# Patient Record
Sex: Male | Born: 1980 | Race: Black or African American | Hispanic: No | Marital: Single | Smoking: Current every day smoker
Health system: Southern US, Community
[De-identification: ages and names within clinical notes are randomized; demographics above are authoritative.]

## PROBLEM LIST (undated history)

## (undated) ENCOUNTER — Emergency Department (HOSPITAL_BASED_OUTPATIENT_CLINIC_OR_DEPARTMENT_OTHER): Payer: Self-pay

## (undated) DIAGNOSIS — Z789 Other specified health status: Secondary | ICD-10-CM

## (undated) HISTORY — PX: NO PAST SURGERIES: SHX2092

## (undated) HISTORY — PX: FOOT SURGERY: SHX648

---

## 2021-08-05 ENCOUNTER — Emergency Department (HOSPITAL_BASED_OUTPATIENT_CLINIC_OR_DEPARTMENT_OTHER): Admit: 2021-08-05 | Discharge: 2021-08-05 | Disposition: A | Discharge status: Home / Self Care | Payer: Self-pay

## 2021-08-05 ENCOUNTER — Inpatient Hospital Stay (HOSPITAL_COMMUNITY)
Admission: EM | Admit: 2021-08-05 | Discharge: 2021-08-07 | DRG: 603 | Disposition: A | Discharge status: Home / Self Care | Payer: Self-pay | Attending: Internal Medicine | Admitting: Internal Medicine

## 2021-08-05 ENCOUNTER — Encounter (HOSPITAL_COMMUNITY): Payer: Self-pay | Admitting: Emergency Medicine

## 2021-08-05 DIAGNOSIS — M7989 Other specified soft tissue disorders: Secondary | ICD-10-CM

## 2021-08-05 DIAGNOSIS — M25572 Pain in left ankle and joints of left foot: Secondary | ICD-10-CM

## 2021-08-05 DIAGNOSIS — Z720 Tobacco use: Secondary | ICD-10-CM | POA: Diagnosis present

## 2021-08-05 DIAGNOSIS — Z5901 Sheltered homelessness: Secondary | ICD-10-CM

## 2021-08-05 DIAGNOSIS — F17210 Nicotine dependence, cigarettes, uncomplicated: Secondary | ICD-10-CM | POA: Diagnosis present

## 2021-08-05 DIAGNOSIS — L03116 Cellulitis of left lower limb: Principal | ICD-10-CM | POA: Diagnosis present

## 2021-08-05 DIAGNOSIS — K219 Gastro-esophageal reflux disease without esophagitis: Secondary | ICD-10-CM | POA: Diagnosis present

## 2021-08-05 DIAGNOSIS — Z716 Tobacco abuse counseling: Secondary | ICD-10-CM

## 2021-08-05 DIAGNOSIS — R590 Localized enlarged lymph nodes: Secondary | ICD-10-CM | POA: Diagnosis present

## 2021-08-05 DIAGNOSIS — L03115 Cellulitis of right lower limb: Secondary | ICD-10-CM | POA: Diagnosis present

## 2021-08-05 DIAGNOSIS — F69 Unspecified disorder of adult personality and behavior: Secondary | ICD-10-CM | POA: Diagnosis present

## 2021-08-05 DIAGNOSIS — F32A Depression, unspecified: Secondary | ICD-10-CM | POA: Diagnosis present

## 2021-08-05 DIAGNOSIS — Z20822 Contact with and (suspected) exposure to covid-19: Secondary | ICD-10-CM | POA: Diagnosis present

## 2021-08-05 HISTORY — DX: Other specified health status: Z78.9

## 2021-08-05 LAB — CBC WITH DIFFERENTIAL/PLATELET
Abs Immature Granulocytes: 0.02 10*3/uL (ref 0.00–0.07)
Basophils Absolute: 0 10*3/uL (ref 0.0–0.1)
Basophils Relative: 0 %
Eosinophils Absolute: 0.2 10*3/uL (ref 0.0–0.5)
Eosinophils Relative: 2 %
HCT: 47.5 % (ref 39.0–52.0)
Hemoglobin: 16.2 g/dL (ref 13.0–17.0)
Immature Granulocytes: 0 %
Lymphocytes Relative: 34 %
Lymphs Abs: 3.1 10*3/uL (ref 0.7–4.0)
MCH: 31.2 pg (ref 26.0–34.0)
MCHC: 34.1 g/dL (ref 30.0–36.0)
MCV: 91.5 fL (ref 80.0–100.0)
Monocytes Absolute: 0.8 10*3/uL (ref 0.1–1.0)
Monocytes Relative: 8 %
Neutro Abs: 5 10*3/uL (ref 1.7–7.7)
Neutrophils Relative %: 56 %
Platelets: 320 10*3/uL (ref 150–400)
RBC: 5.19 MIL/uL (ref 4.22–5.81)
RDW: 13.2 % (ref 11.5–15.5)
WBC: 9.2 10*3/uL (ref 4.0–10.5)
nRBC: 0 % (ref 0.0–0.2)

## 2021-08-05 LAB — BASIC METABOLIC PANEL
Anion gap: 12 (ref 5–15)
BUN: 13 mg/dL (ref 6–20)
CO2: 28 mmol/L (ref 22–32)
Calcium: 8.8 mg/dL — ABNORMAL LOW (ref 8.9–10.3)
Chloride: 104 mmol/L (ref 98–111)
Creatinine, Ser: 0.9 mg/dL (ref 0.61–1.24)
GFR, Estimated: >60 mL/min (ref >60)
Glucose, Bld: 89 mg/dL (ref 70–99)
Potassium: 3.9 mmol/L (ref 3.5–5.1)
Sodium: 144 mmol/L (ref 135–145)

## 2021-08-05 LAB — RAPID URINE DRUG SCREEN, HOSP PERFORMED
Amphetamines: NOT DETECTED
Barbiturates: NOT DETECTED
Benzodiazepines: NOT DETECTED
Cocaine: POSITIVE — AB
Opiates: NOT DETECTED
Tetrahydrocannabinol: POSITIVE — AB

## 2021-08-05 LAB — RESP PANEL BY RT-PCR (FLU A&B, COVID) ARPGX2
Influenza A by PCR: NEGATIVE
Influenza B by PCR: NEGATIVE
SARS Coronavirus 2 by RT PCR: NEGATIVE

## 2021-08-05 LAB — ETHANOL: Alcohol, Ethyl (B): <10 mg/dL (ref <10)

## 2021-08-05 MED ORDER — LORAZEPAM 2 MG/ML IJ SOLN
0.0000 mg | Freq: Four times a day (QID) | INTRAMUSCULAR | Status: DC
  Administered 2021-08-05: 2 mg via INTRAVENOUS

## 2021-08-05 MED ORDER — FOLIC ACID 1 MG PO TABS
1.0000 mg | ORAL_TABLET | Freq: Every day | ORAL | Status: DC
  Administered 2021-08-05 – 2021-08-07 (×3): 1 mg via ORAL
  Filled 2021-08-05 (×3): qty 1

## 2021-08-05 MED ORDER — IBUPROFEN 200 MG PO TABS
400.0000 mg | ORAL_TABLET | ORAL | Status: DC | PRN
  Administered 2021-08-07: 400 mg via ORAL
  Filled 2021-08-05: qty 2

## 2021-08-05 MED ORDER — THIAMINE HCL 100 MG PO TABS
100.0000 mg | ORAL_TABLET | Freq: Every day | ORAL | Status: DC
  Administered 2021-08-05 – 2021-08-07 (×3): 100 mg via ORAL
  Filled 2021-08-05 (×3): qty 1

## 2021-08-05 MED ORDER — ACETAMINOPHEN 650 MG RE SUPP: 650.0000 mg | Freq: Four times a day (QID) | RECTAL | Status: DC | PRN

## 2021-08-05 MED ORDER — ADULT MULTIVITAMIN W/MINERALS CH
1.0000 | ORAL_TABLET | Freq: Every day | ORAL | Status: DC
  Administered 2021-08-05 – 2021-08-07 (×3): 1 via ORAL
  Filled 2021-08-05 (×3): qty 1

## 2021-08-05 MED ORDER — THIAMINE HCL 100 MG/ML IJ SOLN: 100.0000 mg | Freq: Every day | INTRAMUSCULAR | Status: DC

## 2021-08-05 MED ORDER — LORAZEPAM 2 MG/ML IJ SOLN: 0.0000 mg | Freq: Two times a day (BID) | INTRAMUSCULAR | Status: DC

## 2021-08-05 MED ORDER — ONDANSETRON HCL 4 MG/2ML IJ SOLN: 4.0000 mg | Freq: Four times a day (QID) | INTRAMUSCULAR | Status: DC | PRN

## 2021-08-05 MED ORDER — LORAZEPAM 2 MG/ML IJ SOLN
1.0000 mg | INTRAMUSCULAR | Status: DC | PRN
  Filled 2021-08-05: qty 1

## 2021-08-05 MED ORDER — LORAZEPAM 1 MG PO TABS
1.0000 mg | ORAL_TABLET | ORAL | Status: DC | PRN
  Administered 2021-08-05: 1 mg via ORAL
  Administered 2021-08-06: 2 mg via ORAL
  Filled 2021-08-05 (×2): qty 2
  Filled 2021-08-05: qty 1

## 2021-08-05 MED ORDER — FAMOTIDINE 20 MG PO TABS
20.0000 mg | ORAL_TABLET | Freq: Two times a day (BID) | ORAL | Status: DC
  Administered 2021-08-05 – 2021-08-07 (×5): 20 mg via ORAL
  Filled 2021-08-05 (×5): qty 1

## 2021-08-05 MED ORDER — ACETAMINOPHEN 325 MG PO TABS
650.0000 mg | ORAL_TABLET | Freq: Four times a day (QID) | ORAL | Status: DC | PRN
  Administered 2021-08-06: 650 mg via ORAL
  Filled 2021-08-05: qty 2

## 2021-08-05 MED ORDER — ENOXAPARIN SODIUM 40 MG/0.4ML IJ SOSY
40.0000 mg | PREFILLED_SYRINGE | INTRAMUSCULAR | Status: DC
  Administered 2021-08-05 – 2021-08-06 (×2): 40 mg via SUBCUTANEOUS
  Filled 2021-08-05 (×2): qty 0.4

## 2021-08-05 MED ORDER — ONDANSETRON HCL 4 MG PO TABS: 4.0000 mg | ORAL_TABLET | Freq: Four times a day (QID) | ORAL | Status: DC | PRN

## 2021-08-05 MED ORDER — SODIUM CHLORIDE 0.9 % IV SOLN
1.0000 g | Freq: Once | INTRAVENOUS | Status: AC
  Administered 2021-08-05: 1 g via INTRAVENOUS
  Filled 2021-08-05: qty 10

## 2021-08-05 MED ORDER — CEFTRIAXONE SODIUM 1 G IJ SOLR
1.0000 g | INTRAMUSCULAR | Status: DC
Start: 2021-08-06 — End: 2021-08-07
  Administered 2021-08-06 – 2021-08-07 (×2): 1 g via INTRAVENOUS
  Filled 2021-08-05 (×2): qty 10
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
## 2021-08-05 NOTE — Progress Notes (Signed)
TRH H&P addendum:  The behavioral health service has requested UDS, alcohol level, thiamine and preemptive EtOH withdrawal orders.  These orders have already been placed per their request.  Sanda Klein, MD.

## 2021-08-05 NOTE — ED Notes (Signed)
VAS US at bedside. 

## 2021-08-05 NOTE — ED Provider Notes (Signed)
Accepted handoff at shift change from [employer]. Please see prior provider note for more detail.   Briefly: Patient is 40 y.o.   DDX: concern for cellulitis vs DVT and cellulitis  Plan: DVT study pending. The patient is not a candidate for Dalbavancin. Will admit.    Physical Exam  BP 138/90   Pulse 93   Temp 98.6 F (37 C)   Resp 16   Ht 5\' 11"  (1.803 m)   Wt 81.6 kg   SpO2 98%   BMI 25.10 kg/m   Physical Exam  ED Course/Procedures     Procedures  MDM  I received this patient during handoff. Plan is to admit based cellulitis, DVT study ordered to r/o. DVT came back negative, with small cystic area in area of pop fossa 3.8 x 3.3 x 1.0 cm. VSS. Admitted to Dr. for LLE cellulitis.        Robb Matar, PA-C 08/05/21 1138    10/05/21, MD 08/08/21 1112
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
## 2021-08-05 NOTE — ED Notes (Signed)
Updated patient about plan of care and waiting on an admission bed.  NAD.

## 2021-08-05 NOTE — Progress Notes (Signed)
LLE venous duplex has been completed.  Preliminary results given to Riley Ransom, PA. 

## 2021-08-06 ENCOUNTER — Other Ambulatory Visit (HOSPITAL_COMMUNITY): Payer: Self-pay

## 2021-08-06 DIAGNOSIS — L03115 Cellulitis of right lower limb: Secondary | ICD-10-CM | POA: Diagnosis present

## 2021-08-06 DIAGNOSIS — M7989 Other specified soft tissue disorders: Secondary | ICD-10-CM

## 2021-08-06 DIAGNOSIS — Z720 Tobacco use: Secondary | ICD-10-CM

## 2021-08-06 LAB — BASIC METABOLIC PANEL
Anion gap: 7 (ref 5–15)
BUN: 15 mg/dL (ref 6–20)
CO2: 26 mmol/L (ref 22–32)
Calcium: 9.2 mg/dL (ref 8.9–10.3)
Chloride: 110 mmol/L (ref 98–111)
Creatinine, Ser: 0.87 mg/dL (ref 0.61–1.24)
GFR, Estimated: >60 mL/min (ref >60)
Glucose, Bld: 117 mg/dL — ABNORMAL HIGH (ref 70–99)
Potassium: 4.1 mmol/L (ref 3.5–5.1)
Sodium: 143 mmol/L (ref 135–145)

## 2021-08-06 LAB — HIV ANTIBODY (ROUTINE TESTING W REFLEX): HIV Screen 4th Generation wRfx: NONREACTIVE

## 2021-08-06 MED ORDER — ESCITALOPRAM OXALATE 10 MG PO TABS
5.0000 mg | ORAL_TABLET | Freq: Every day | ORAL | Status: DC
  Administered 2021-08-06 – 2021-08-07 (×2): 5 mg via ORAL
  Filled 2021-08-06 (×2): qty 1

## 2021-08-06 MED ORDER — TRAZODONE HCL 50 MG PO TABS
25.0000 mg | ORAL_TABLET | Freq: Every day | ORAL | Status: DC
  Administered 2021-08-06: 25 mg via ORAL
  Filled 2021-08-06: qty 1

## 2021-08-06 NOTE — TOC Initial Note (Addendum)
Transition of Care [facility]) - Initial/Assessment Note    Patient Details  Name: Zachary Kelley MRN: [id number] Date of Birth: [birth year]  [facility]) CM/SW Contact:    Lanier Clam, RN Phone Number: 08/06/2021, 12:23 PM  Clinical Narrative:Spoke to patient about d/c plans-stays in hotel hallways-agreed to CM assisting w/[facility] day shelter resource-he is familiar with-Also provided with shelter resource list-encouraged to call to be placed on wait list or if has a bed decide if he can go there. [facility] can manage meds if appropriate.Nsg can provide ambulatory transportation through [facility] safe ride, & contact chaplain for clothes. Continue to monitor.                 4p-Will provide MATCH program-[facility] beds to bed tomorrow.  Expected Discharge Plan: Homeless Shelter Barriers to Discharge: Continued Medical Work up   Patient Goals and CMS Choice        Expected Discharge Plan and Services Expected Discharge Plan: Homeless Shelter                                              Prior Living Arrangements/Services                       Activities of Daily Living Home Assistive Devices/Equipment: None ADL Screening (condition at time of admission) Patient's cognitive ability adequate to safely complete daily activities?: Yes Is the patient deaf or have difficulty hearing?: No Does the patient have difficulty seeing, even when wearing glasses/contacts?: No Does the patient have difficulty concentrating, remembering, or making decisions?: No Patient able to express need for assistance with ADLs?: Yes Does the patient have difficulty dressing or bathing?: No Independently performs ADLs?: Yes (appropriate for developmental age) Does the patient have difficulty walking or climbing stairs?: No Weakness of Legs: None Weakness of Arms/Hands: None  Permission Sought/Granted                  Emotional Assessment               Admission diagnosis:  Cellulitis of left lower extremity [L03.116] Left leg swelling [M79.89] Patient Active Problem List   Diagnosis Date Noted   Cellulitis of left lower extremity 08/05/2021   Hypocalcemia 08/05/2021   Tobacco abuse 08/05/2021   Behavior concern in adult 08/05/2021   PCP:  Pcp, No Pharmacy:   [id number] - [place], [place] - [address] AT [facility] [address] [place] [address] Phone: [phone] Fax: [phone]     Social Determinants of Health (SDOH) Interventions    Readmission Risk Interventions No flowsheet data found.

## 2021-08-06 NOTE — Progress Notes (Signed)
PROGRESS NOTE    Zachary Kelley  ZOX:[id number] DOB: 1981-09-05 DOA: 08/05/2021 PCP: Pcp, No    Brief Narrative:  Zachary Kelley presented with LLE cellulitis. HX of an unspecified mental history, psychiatry has seen him. He reports several days of LLE pain and erythema, [place] of LLE in ED did not show any findings of DVT. Initial labs showed a neutrophilic shift of a wbc 9.2. Antibiotic treatment given, pain controlled, and psych questions were addressed with psychiatry.    Assessment & Plan:   Principal Problem:   Cellulitis of left lower extremity Active Problems:   Hypocalcemia   Tobacco abuse   Behavior concern in adult   Cellulitis:  LLE is tender, warm to the touch  DVT not present per LLE [place]  Normal WBC with neutrophilic shift   Plan: Ceftriaxone IV initially Transition to po Cephalexin or Cefuroxime  Hypocalcemia: Resolved  Tobacco Abuse -cessation information  Behavior Concern in Adult -seen by psych -escitalopram 5mg  po qd -trazodone 25mg  qhs for sleep prn    Patient continue to be at high risk for cellultis  Status is: Observation  The patient remains OBS appropriate and will d/c before 2 midnights.  Dispo: The patient is from:  homeless , hotel              Anticipated d/c is to:  the hotel              Patient currently is medically stable to d/c.   Difficult to place patient No       DVT prophylaxis: lovenox  Code Status:   full  Family Communication:  none        Skin Documentation:  Left lower leg is tender and slightly swollen   Consultants:  Psychiatry   Procedures:  LE   Antimicrobials:  Ceftriaxone 1g 10/5- current   Subjective: He reports only left ankle pain and tenderness with palpation  Objective: Vitals:   08/05/21 2300 08/06/21 0444 08/06/21 0800 08/06/21 1400  BP: 120/72 101/63 122/74 132/77  Pulse: 67 72 69 68  Resp: 18 15 16 18   Temp: 98.3 F (36.8 C) 98.5 F (36.9 C) 98.9 F (37.2 C) 98 F  (36.7 C)  TempSrc: Oral Oral Oral Oral  SpO2: 100% 100% 100% 100%  Weight:      Height:        Intake/Output Summary (Last 24 hours) at 08/06/2021 1426 Last data filed at 08/06/2021 Gross per 24 hour  Intake 100 ml  Output --  Net 100 ml   Filed Weights   08/05/21 0515  Weight: 81.6 kg    Examination:   General: Not in pain or dyspnea  Neurology: Awake and alert, non focal   E ENT: no pallor, no icterus, oral mucosa moist  Cardiovascular: No JVD. S1-S2 present, rhythmic, no gallops, rubs, or murmurs. No lower extremity edema.  Pulmonary: vesicular breath sounds bilaterally, adequate air movement, no wheezing, rhonchi or rales.  Gastrointestinal. Abdomen flat, no organomegaly, non tender, no rebound or guarding  Skin. No rashes  Musculoskeletal: no joint deformities     Data Reviewed: I have personally reviewed following labs and imaging studies  CBC: Recent Labs  Lab 08/05/21 0546  WBC 9.2  NEUTROABS 5.0  HGB 16.2  HCT 47.5  MCV 91.5  PLT 320   Basic Metabolic Panel: Recent Labs  Lab 08/05/21 0546 08/06/21 0411  NA 144 143  K 3.9 4.1  CL 104 110  CO2 28 26  GLUCOSE 89 117*  BUN 13 15  CREATININE 0.90 0.87  CALCIUM 8.8* 9.2   GFR: Estimated Creatinine Clearance: 122.6 mL/min (by C-G formula based on SCr of 0.87 mg/dL). Liver Function Tests: No results for input(s): AST, ALT, ALKPHOS, BILITOT, PROT, ALBUMIN in the last 168 hours. No results for input(s): LIPASE, AMYLASE in the last 168 hours. No results for input(s): AMMONIA in the last 168 hours. Coagulation Profile: No results for input(s): INR, PROTIME in the last 168 hours. Cardiac Enzymes: No results for input(s): CKTOTAL, CKMB, CKMBINDEX, TROPONINI in the last 168 hours. BNP (last 3 results) No results for input(s): PROBNP in the last 8760 hours. HbA1C: No results for input(s): HGBA1C in the last 72 hours. CBG: No results for input(s): GLUCAP in the last 168 hours. Lipid  Profile: No results for input(s): CHOL, HDL, LDLCALC, TRIG, CHOLHDL, LDLDIRECT in the last 72 hours. Thyroid Function Tests: No results for input(s): TSH, T4TOTAL, FREET4, T3FREE, THYROIDAB in the last 72 hours. Anemia Panel: No results for input(s): VITAMINB12, FOLATE, FERRITIN, TIBC, IRON, RETICCTPCT in the last 72 hours.    Radiology Studies: I have reviewed all of the imaging during this hospital visit personally     Scheduled Meds:  enoxaparin (LOVENOX) injection  40 mg Subcutaneous Q24H   escitalopram  5 mg Oral Daily   famotidine  20 mg Oral BID   folic acid  1 mg Oral Daily   multivitamin with minerals  1 tablet Oral Daily   thiamine  100 mg Oral Daily   Or   thiamine  100 mg Intravenous Daily   traZODone  25 mg Oral QHS   Continuous Infusions:  cefTRIAXone (ROCEPHIN)  IV 1 g (08/06/21 0605)     LOS: 0 days        Randell Loop, student
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
## 2021-08-07 ENCOUNTER — Other Ambulatory Visit (HOSPITAL_COMMUNITY): Payer: Self-pay

## 2021-08-07 DIAGNOSIS — L03115 Cellulitis of right lower limb: Secondary | ICD-10-CM

## 2021-08-07 DIAGNOSIS — F69 Unspecified disorder of adult personality and behavior: Secondary | ICD-10-CM

## 2021-08-07 MED ORDER — IBUPROFEN 400 MG PO TABS
400.0000 mg | ORAL_TABLET | Freq: Three times a day (TID) | ORAL | 0 refills | Status: DC | PRN
  Filled 2021-08-07: qty 30, 10d supply, fill #0

## 2021-08-07 MED ORDER — IBUPROFEN 400 MG PO TABS
400.0000 mg | ORAL_TABLET | Freq: Four times a day (QID) | ORAL | 0 refills | Status: DC | PRN
  Filled 2021-08-07: qty 30, 8d supply, fill #0

## 2021-08-07 MED ORDER — TRIPLE ANTIBIOTIC 3.5-400-5000 EX OINT
TOPICAL_OINTMENT | Freq: Two times a day (BID) | CUTANEOUS | Status: DC
  Filled 2021-08-07 (×2): qty 14

## 2021-08-07 MED ORDER — NALOXONE HCL 0.4 MG/ML IJ SOLN: 0.4000 mg | INTRAMUSCULAR | Status: DC | PRN

## 2021-08-07 MED ORDER — ESCITALOPRAM OXALATE 5 MG PO TABS
5.0000 mg | ORAL_TABLET | Freq: Every day | ORAL | 0 refills | Status: DC
  Filled 2021-08-07: qty 30, 30d supply, fill #0

## 2021-08-07 MED ORDER — ACETAMINOPHEN 325 MG PO TABS: 650.0000 mg | ORAL_TABLET | Freq: Four times a day (QID) | ORAL | Status: DC | PRN

## 2021-08-07 MED ORDER — CEPHALEXIN 500 MG PO CAPS
500.0000 mg | ORAL_CAPSULE | Freq: Three times a day (TID) | ORAL | 0 refills | Status: DC
  Filled 2021-08-07: qty 21, 7d supply, fill #0

## 2021-08-07 MED ORDER — CEPHALEXIN 500 MG PO CAPS
500.0000 mg | ORAL_CAPSULE | Freq: Three times a day (TID) | ORAL | Status: DC
  Administered 2021-08-07: 500 mg via ORAL
  Filled 2021-08-07: qty 1

## 2021-08-07 MED ORDER — TRAZODONE HCL 50 MG PO TABS
25.0000 mg | ORAL_TABLET | Freq: Every day | ORAL | 0 refills | Status: DC
  Filled 2021-08-07: qty 15, 30d supply, fill #0

## 2021-08-07 MED ORDER — NALOXONE HCL 4 MG/0.1ML NA LIQD
0.4000 mg | NASAL | 0 refills | Status: DC | PRN
  Filled 2021-08-07: qty 2, 1d supply, fill #0

## 2021-08-07 MED ORDER — BACITRACIN-NEOMYCIN-POLYMYXIN OINTMENT TUBE
1.0000 "application " | TOPICAL_OINTMENT | Freq: Two times a day (BID) | CUTANEOUS | 0 refills | Status: DC
  Filled 2021-08-07 (×2): qty 28.4, 14d supply, fill #0
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
## 2021-08-07 NOTE — TOC Transition Note (Addendum)
Transition of Care [facility]) - CM/SW Discharge Note   Patient Details  Name: Zachary Kelley MRN: [id number] Date of Birth: Mar 14, 1981  [facility] CM/SW Contact:  Lanier Clam, RN Phone Number: 08/07/2021, 1:08 PM   Clinical Narrative:MATCH program w/override has been provided;[facility] otpt pharmacy delivered meds to bed. All info has been provided see prior note. Bus pass requested per nsg. No further CM needs.   -Informed patient yesterday to wear hospital ID as an ID for shelter since he doesn't have an ID, & the hospital does not provide any other type of ID.  Final next level of care: Homeless Shelter Barriers to Discharge: No Barriers Identified   Patient Goals and CMS Choice        Discharge Placement                       Discharge Plan and Services                                     Social Determinants of Health (SDOH) Interventions     Readmission Risk Interventions No flowsheet data found.

## 2021-08-07 NOTE — Progress Notes (Signed)
Chaplain received a consult to see patient related to homelessness and major life transitions.  Chaplain spoke with patient who was concerned because he had lost his ID and if he is discharged over the weekend, he won't be able to present to a shelter or get a hotel without his ID.  Chaplain explained that discharge was determined by the medical team and spoke with patient's nurse who was very aware of the situation.    Chaplain Dyanne Carrel, Bcc Pager, [phone] 10:58 PM

## 2021-08-07 NOTE — Progress Notes (Signed)
RN provided discharge instructions and AVS.  Bus pass given. Discharge medication at bedside. Patient is refusing to be discharged. Patient stating that he is unable to get an ID until Monday morning and that he needs an ID for homeless shelter. Stating that he has no where to go tonight without an ID. RN will get DD and Dr. Ella Jubilee to assist with discharging patient.

## 2021-08-07 NOTE — Progress Notes (Signed)
Patient decided to leave after dinner. Discharge instruction reviewed again. Patient verbalized understanding. And finally accepting discharge plan.

## 2021-08-07 NOTE — Consult Note (Signed)
[name] Psychiatry New Psychiatric Evaluation   Service Date: August 07, 2021 LOS:  LOS: 1 day    Assessment  Zachary Kelley is a 40 y.o. male admitted medically for 08/05/2021  5:04 AM for cellulitis. 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    His current presentation of depressive symptoms (low energy, motivation, etc) is most consistent with untreated depression. He did adamantly deny any head banging or history of self harm behavior; unclear at this point whether or not this occurred but has reportedly been appropriate throughout hospitalization. He has a previous unknown diagnosis of a psychotic spectrum disorder (treated with risperidone and what sounds like a LAI) however was very clear that he had previously feigned psychotic symptoms in an attempt to get SSI; whether this is true or not he is currently denying any AH, VH, other psychotic symptoms, and does not require antipsychotic medications at this time. He made some attempts to maximize inpatient treatment during interview (particularly when talking about EtOH) however overall answered questions appropriately and appeared to be truthful.  He has no current outpatient psychotropic medications but was amenable to trial of medications for depression and sleep. On initial examination, patient was generally appropriate. Please see plan below for detailed recommendations.    10/7: pt doing well today, plan for discharge medically. Asks for another copy of housing resources.   Diagnoses:  Active Hospital problems: Principal Problem:   Cellulitis of left lower extremity Active Problems:   Hypocalcemia   Tobacco abuse   Behavior concern in adult   Cellulitis of right knee   Left leg swelling    Problems edited/added by me: No  problems updated.  Plan  ## Safety and Observation Level:  - Based on my clinical evaluation, I estimate the patient to be at low risk of self harm in the current setting - At this time, we recommend a routine level of observation. This decision is based on my review of the chart including patient's history and current presentation, interview of the patient, mental status examination, and consideration of suicide risk including evaluating suicidal ideation, plan, intent, suicidal or self-harm behaviors, risk factors, and protective factors. This judgment is based on our ability to directly address suicide risk, implement suicide prevention strategies and develop a safety plan while the patient is in the clinical setting. Please contact our team if there is a concern that risk level has changed.   ## Medications:  -- c escitalopram 5 mg -- c trazodone 25 mg (schedule in hospital, PRN at dc)  -- full r/b/se discussion focusing on not using trazodone when sleeping in unsafe spaces, also discussed priaprism   -- please prescribe narcan at dc (pt in high risk group, lots of drug supply contaminated with fentanyl, may need for someone else)  ## Medical Decision Making Capacity:  Not formally assessed  ## Further Work-up:  -- lipid panel/a1c (previous antipsychotic exposure) -- TSH -- B12/folate  Workup not ordered, at this point would not change management.   UDS (+) for cocaine which he denied to me  ## Disposition:  -- no psychiatric contraindicaiton to medical discharge -- [facility] in dc packet  ## Behavioral / Environmental:  -- please keep pt's door closed, minimize overnight interactions  ##Legal Status --  voluntary  Thank you for this consult request. Recommendations have been communicated to the primary team.  We will continue to follow at this time.  Loretto Belinsky A Millena Callins    NEW  history  Relevant Aspects of Hospital Course:  Admitted on  08/05/2021 for lower extremity cellulitis. Has generally been appropriate since admission, no behavioral episodes or further head banging noted.  Patient Report:  Patient seen in late afternoon. Had slept well - some frustration about not being able to get an ID until Monday keeping him from housing resources. Understands not to take trazodone when sleeping in unsafe space, also discussed priaprism today. No SI, HI, AH/VH.   ROS:  (+) pain in leg  Psychiatric History:  Information collected from pt and included above   Prior psychotic spectrum diagnosis (pt claims he was malingering) Treated with risperidone and an unknown substance, believes risperidone made his hair fall out. Sounds like LAI in past, no ECT Prior psychiatric hospitalizations (remote), longest about 1 month (not entirely congruent with above history)  Family psych history: unknown  Medical History: Past Medical History:  Diagnosis Date   Medical history non-contributory     Surgical History: Past Surgical History:  Procedure Laterality Date   NO PAST SURGERIES      Medications:   Current Facility-Administered Medications:    acetaminophen (TYLENOL) tablet 650 mg, 650 mg, Oral, Q6H PRN, 650 mg at 08/06/21 2123 **OR** acetaminophen (TYLENOL) suppository 650 mg, 650 mg, Rectal, Q6H PRN, Bobette Mo, MD   cephALEXin [facility]) capsule 500 mg, 500 mg, Oral, Q8H, Arrien, York Ram, MD, 500 mg at 08/07/21 1304   enoxaparin (LOVENOX) injection 40 mg, 40 mg, Subcutaneous, Q24H, Bobette Mo, MD, 40 mg at 08/06/21 1302   escitalopram (LEXAPRO) tablet 5 mg, 5 mg, Oral, Daily, Ciclaly Mulcahey A, 5 mg at 08/07/21 0941   famotidine (PEPCID) tablet 20 mg, 20 mg, Oral, BID, Bobette Mo, MD, 20 mg at 08/07/21 9937   folic acid (FOLVITE) tablet 1 mg, 1 mg, Oral, Daily, Bobette Mo, MD, 1 mg at 08/07/21 1696   ibuprofen (ADVIL) tablet 400 mg, 400 mg, Oral, Q4H PRN, Bobette Mo, MD,  400 mg at 08/07/21 1304   LORazepam (ATIVAN) tablet 1-4 mg, 1-4 mg, Oral, Q1H PRN, 2 mg at 08/06/21 1007 **OR** LORazepam (ATIVAN) injection 1-4 mg, 1-4 mg, Intravenous, Q1H PRN, Bobette Mo, MD   multivitamin with minerals tablet 1 tablet, 1 tablet, Oral, Daily, Bobette Mo, MD, 1 tablet at 08/07/21 7893   naloxone [facility]) injection 0.4 mg, 0.4 mg, Intravenous, PRN, Arrien, York Ram, MD   ondansetron [facility]) tablet 4 mg, 4 mg, Oral, Q6H PRN **OR** ondansetron (ZOFRAN) injection 4 mg, 4 mg, Intravenous, Q6H PRN, Bobette Mo, MD   thiamine tablet 100 mg, 100 mg, Oral, Daily, 100 mg at 08/07/21 0942 **OR** thiamine (B-1) injection 100 mg, 100 mg, Intravenous, Daily, Bobette Mo, MD   traZODone (DESYREL) tablet 25 mg, 25 mg, Oral, QHS, Shekela Goodridge A, 25 mg at 08/06/21 2123   Triple Antibiotic [phone] OINT, , Topical, BID, Arrien, York Ram, MD, Given at 08/07/21 1305  Allergies: No Known Allergies  Social History:  Homeless, 3 kids, looking for a job  Alcohol use: 6-7 beers daily Drug use: endorsed weed, denied other substances (UDS+ THC/cocaine)  The patient's family history is not on file.    Objective  Vital signs:  Temp:  [97.6 F (36.4 C)-98.6 F (37 C)] 97.6 F (36.4  C) (10/07 7673) Pulse Rate:  [68-73] 73 (10/07 0937) Resp:  [18-20] 20 (10/07 0937) BP: (124-139)/(79-80) 124/80 (10/07 0937) SpO2:  [98 %-100 %] 100 % (10/07 0937)  Physical Exam: Gen: walking around room in boxers Head: keeps towel over head Pulm: no increased work of breathing Psych: alert and oriented  Mental Status Exam: Appearance: Walking around/packing up, keeps towel over bald spot,  Attitude:  Cooperative, pleasant  Behavior/Psychomotor: No increased speed of gesturing  Speech/Language:  Adequate volume, amount, prosody  Mood: frustrated  Affect: Full range  Thought process: Mildly circumstantial  Thought content:   Devoid of SI, HI,  delusions, paranoia  Perceptual disturbances:  Denies AH/Vh  Attention: Good in conversation  Concentration: Adequate to conversation  Orientation: Fully oriented  Memory: Recent, remote intact   Fund of knowledge:  Good (last 3 presidents)  Insight:   fair  Judgment:  fair  Impulse Control: fair

## 2021-08-11 ENCOUNTER — Emergency Department (HOSPITAL_COMMUNITY): Payer: Self-pay

## 2021-08-11 ENCOUNTER — Emergency Department (HOSPITAL_COMMUNITY)
Admission: EM | Admit: 2021-08-11 | Discharge: 2021-08-12 | Disposition: A | Discharge status: Home / Self Care | Payer: Self-pay | Attending: Emergency Medicine | Admitting: Emergency Medicine

## 2021-08-11 DIAGNOSIS — F17210 Nicotine dependence, cigarettes, uncomplicated: Secondary | ICD-10-CM | POA: Insufficient documentation

## 2021-08-11 DIAGNOSIS — L03116 Cellulitis of left lower limb: Secondary | ICD-10-CM | POA: Insufficient documentation

## 2021-08-11 NOTE — ED Provider Notes (Signed)
MSE was initiated and I personally evaluated the patient and placed orders (if any) at  11:46 PM on August 11, 2021.  Patient to ED with persistent swelling of the left lower leg. Admitted 10/5 for same. No DVT on doppler, no fever. Was discharged with Keflex. He returns now stating the foot is no better, he is homeless and feels he needs to be admitted again. He endorses compliance with Keflex. He endorses chills.  Left lower leg swollen. No redness.   The patient appears stable so that the remainder of the MSE may be completed by another provider.   Elpidio Anis, PA-C 08/11/21 2347    Tilden Fossa, MD 08/12/21 [id number]

## 2021-08-12 ENCOUNTER — Other Ambulatory Visit: Payer: Self-pay

## 2021-08-12 ENCOUNTER — Emergency Department (HOSPITAL_COMMUNITY): Payer: Self-pay

## 2021-08-12 ENCOUNTER — Other Ambulatory Visit (HOSPITAL_COMMUNITY): Payer: Self-pay

## 2021-08-12 DIAGNOSIS — L03116 Cellulitis of left lower limb: Secondary | ICD-10-CM | POA: Insufficient documentation

## 2021-08-12 DIAGNOSIS — F17210 Nicotine dependence, cigarettes, uncomplicated: Secondary | ICD-10-CM | POA: Insufficient documentation

## 2021-08-12 LAB — CBC WITH DIFFERENTIAL/PLATELET
Abs Immature Granulocytes: 0.03 10*3/uL (ref 0.00–0.07)
Basophils Absolute: 0 10*3/uL (ref 0.0–0.1)
Basophils Relative: 1 %
Eosinophils Absolute: 0.2 10*3/uL (ref 0.0–0.5)
Eosinophils Relative: 2 %
HCT: 45.4 % (ref 39.0–52.0)
Hemoglobin: 14.8 g/dL (ref 13.0–17.0)
Immature Granulocytes: 0 %
Lymphocytes Relative: 42 %
Lymphs Abs: 3.6 10*3/uL (ref 0.7–4.0)
MCH: 30.8 pg (ref 26.0–34.0)
MCHC: 32.6 g/dL (ref 30.0–36.0)
MCV: 94.4 fL (ref 80.0–100.0)
Monocytes Absolute: 0.9 10*3/uL (ref 0.1–1.0)
Monocytes Relative: 11 %
Neutro Abs: 3.7 10*3/uL (ref 1.7–7.7)
Neutrophils Relative %: 44 %
Platelets: 303 10*3/uL (ref 150–400)
RBC: 4.81 MIL/uL (ref 4.22–5.81)
RDW: 13 % (ref 11.5–15.5)
WBC: 8.4 10*3/uL (ref 4.0–10.5)
nRBC: 0 % (ref 0.0–0.2)

## 2021-08-12 MED ORDER — DOXYCYCLINE HYCLATE 100 MG PO CAPS
100.0000 mg | ORAL_CAPSULE | Freq: Two times a day (BID) | ORAL | 0 refills | Status: DC
  Filled 2021-08-12: qty 14, 7d supply, fill #0

## 2021-08-12 MED ORDER — DOXYCYCLINE HYCLATE 100 MG PO TABS
100.0000 mg | ORAL_TABLET | Freq: Once | ORAL | Status: AC
  Administered 2021-08-12: 100 mg via ORAL
  Filled 2021-08-12: qty 1
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
## 2021-08-12 NOTE — ED Triage Notes (Signed)
Pt complains of swelling to the left lower leg x 1 week.

## 2021-08-13 ENCOUNTER — Other Ambulatory Visit: Payer: Self-pay

## 2021-08-13 ENCOUNTER — Emergency Department (HOSPITAL_COMMUNITY)
Admission: EM | Admit: 2021-08-13 | Discharge: 2021-08-13 | Disposition: A | Discharge status: Home / Self Care | Payer: Self-pay | Attending: Emergency Medicine | Admitting: Emergency Medicine

## 2021-08-13 ENCOUNTER — Encounter (HOSPITAL_COMMUNITY): Payer: Self-pay

## 2021-08-13 ENCOUNTER — Emergency Department (HOSPITAL_COMMUNITY): Payer: Self-pay

## 2021-08-13 DIAGNOSIS — L03116 Cellulitis of left lower limb: Secondary | ICD-10-CM

## 2021-08-13 LAB — BASIC METABOLIC PANEL
Anion gap: 9 (ref 5–15)
BUN: 12 mg/dL (ref 6–20)
CO2: 25 mmol/L (ref 22–32)
Calcium: 8.8 mg/dL — ABNORMAL LOW (ref 8.9–10.3)
Chloride: 109 mmol/L (ref 98–111)
Creatinine, Ser: 0.8 mg/dL (ref 0.61–1.24)
GFR, Estimated: >60 mL/min (ref >60)
Glucose, Bld: 93 mg/dL (ref 70–99)
Potassium: 3.9 mmol/L (ref 3.5–5.1)
Sodium: 143 mmol/L (ref 135–145)

## 2021-08-13 LAB — HEPATIC FUNCTION PANEL
ALT: 15 U/L (ref 0–44)
AST: 22 U/L (ref 15–41)
Albumin: 3.6 g/dL (ref 3.5–5.0)
Alkaline Phosphatase: 60 U/L (ref 38–126)
Bilirubin, Direct: 0.1 mg/dL (ref 0.0–0.2)
Indirect Bilirubin: 0.2 mg/dL — ABNORMAL LOW (ref 0.3–0.9)
Total Bilirubin: 0.3 mg/dL (ref 0.3–1.2)
Total Protein: 6.2 g/dL — ABNORMAL LOW (ref 6.5–8.1)

## 2021-08-13 LAB — CBC WITH DIFFERENTIAL/PLATELET
Abs Immature Granulocytes: 0.02 10*3/uL (ref 0.00–0.07)
Basophils Absolute: 0.1 10*3/uL (ref 0.0–0.1)
Basophils Relative: 1 %
Eosinophils Absolute: 0.3 10*3/uL (ref 0.0–0.5)
Eosinophils Relative: 4 %
HCT: 41.8 % (ref 39.0–52.0)
Hemoglobin: 13.9 g/dL (ref 13.0–17.0)
Immature Granulocytes: 0 %
Lymphocytes Relative: 45 %
Lymphs Abs: 3.7 10*3/uL (ref 0.7–4.0)
MCH: 31 pg (ref 26.0–34.0)
MCHC: 33.3 g/dL (ref 30.0–36.0)
MCV: 93.1 fL (ref 80.0–100.0)
Monocytes Absolute: 0.6 10*3/uL (ref 0.1–1.0)
Monocytes Relative: 8 %
Neutro Abs: 3.4 10*3/uL (ref 1.7–7.7)
Neutrophils Relative %: 42 %
Platelets: 303 10*3/uL (ref 150–400)
RBC: 4.49 MIL/uL (ref 4.22–5.81)
RDW: 12.8 % (ref 11.5–15.5)
WBC: 8 10*3/uL (ref 4.0–10.5)
nRBC: 0 % (ref 0.0–0.2)

## 2021-08-13 LAB — LIPASE, BLOOD: Lipase: 44 U/L (ref 11–51)

## 2021-08-13 LAB — TROPONIN I (HIGH SENSITIVITY): Troponin I (High Sensitivity): 3 ng/L (ref <18)

## 2021-08-13 MED ORDER — SODIUM CHLORIDE 0.9 % IV SOLN
1.0000 g | Freq: Once | INTRAVENOUS | Status: AC
  Administered 2021-08-13: 1 g via INTRAVENOUS
  Filled 2021-08-13: qty 10

## 2021-08-13 MED ORDER — LIDOCAINE VISCOUS HCL 2 % MT SOLN
15.0000 mL | Freq: Once | OROMUCOSAL | Status: AC
  Administered 2021-08-13: 15 mL via ORAL
  Filled 2021-08-13: qty 15

## 2021-08-13 MED ORDER — CEPHALEXIN 500 MG PO CAPS: 500.0000 mg | ORAL_CAPSULE | Freq: Two times a day (BID) | ORAL | 0 refills | Status: DC

## 2021-08-13 MED ORDER — ALUM & MAG HYDROXIDE-SIMETH 200-200-20 MG/5ML PO SUSP
30.0000 mL | Freq: Once | ORAL | Status: AC
  Administered 2021-08-13: 30 mL via ORAL
  Filled 2021-08-13: qty 30
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
## 2021-08-13 NOTE — Progress Notes (Signed)
CSW attached shelter resources to pt's AVS.   Layani Foronda M.Aziya Arena, MSW, LCSWA [place]   Transitions of Care Clinical Social Worker I Direct Dial: [phone]  Fax: [phone] [email or web address]   

## 2021-08-13 NOTE — Progress Notes (Signed)
08/1321/pt had used Match program this month.  Not eligible for program.

## 2021-08-13 NOTE — Discharge Instructions (Addendum)
Continue taking the antibiotic as directed.  Return for new or worsening symptoms.  Otherwise, please follow-up with your doctor or with the health and wellness clinic in 1 week.

## 2021-08-13 NOTE — ED Notes (Signed)
Patient provided with a turkey sandwich and orange juice 

## 2021-08-13 NOTE — ED Notes (Addendum)
Attempted to go over paperwork with patient. Patient sleeping comfortably and needed to be physically woken up. Patient states that he is now having sharp stomach pains and that he "can't breathe." Patient fell back asleep while RN attempting to assess. Adelina Mings, [place] made aware.
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
## 2021-08-13 NOTE — ED Triage Notes (Signed)
Pt was seen last night for the same, he was told that his medication would be free and they weren't Tonight he says the pain is worse and his left ankle is swollen Pt states that he needs to be admitted or ELSE!

## 2021-08-13 NOTE — ED Provider Notes (Signed)
Assumed from PA [employer] who saw the patient initially overnight, patient was initially set up for discharge pending social work consult to assist with medication cost for antibiotics.  Patient was seen in the ED yesterday and diagnosed with cellulitis of the lower extremity, unable to afford doxycycline.  Was treated with an IV dose of Rocephin and had been sleeping comfortably in the emergency department awaiting consult with transitions of care team.  When nursing staff began to go over discharge paperwork with patient he began complaining of abdominal pain and difficulty breathing.  Patient had not reported this at all throughout the rest of his evaluations and for several hours has been sleeping comfortably and has eaten multiple sandwiches in the department.  On my evaluation patient is sleeping, does not appear to be in any distress and after being reports that he feels like he is having significant difficulty breathing, of note patient has no increased work of breathing and is satting at 98% and able to have complete conversations with him regarding this, and reports pain starting in his abdomen going into his chest, he reports that this has been going on for a few hours but has not reported to anyone.  On abdominal exam he has very mild generalized tenderness that improves with distraction,, no peritoneal signs.  Patient had basic lab work done during his initial evaluation given new complaints we will add on hepatic panel, lipase, troponin, EKG and chest x-ray.  GI cocktail given.  After speaking with patient he was quickly able to go back to sleep and seems to be in no distress.   Physical Exam  BP 117/74 (BP Location: Left Arm)   Pulse 81   Temp 98.8 F (37.1 C)   Resp 15   Ht 5\' 10"  (1.778 m)   Wt 84.8 kg   SpO2 98%   BMI 26.83 kg/m   Physical Exam Vitals and nursing note reviewed.  Constitutional:      General: He is not in acute distress.    Appearance: Normal appearance. He  is well-developed. He is not diaphoretic.     Comments: Sleeping comfortably, awakens to verbal stimuli and sternal rub and is in no acute distress  HENT:     Head: Normocephalic and atraumatic.  Eyes:     General:        Right eye: No discharge.        Left eye: No discharge.     Pupils: Pupils are equal, round, and reactive to light.  Cardiovascular:     Rate and Rhythm: Normal rate and regular rhythm.     Pulses: Normal pulses.     Heart sounds: Normal heart sounds. No murmur heard.   No friction rub. No gallop.  Pulmonary:     Effort: Pulmonary effort is normal. No respiratory distress.     Breath sounds: Normal breath sounds. No wheezing or rales.     Comments: Respirations equal and unlabored, patient able to speak in full sentences, Satting 98% on room air, lungs clear to auscultation bilaterally  Abdominal:     General: Bowel sounds are normal. There is no distension.     Palpations: Abdomen is soft. There is no mass.     Tenderness: There is abdominal tenderness. There is no guarding.     Comments: Abdomen soft, nondistended, very mild generalized tenderness that seems to be improved with distraction, no guarding or peritoneal signs.  Musculoskeletal:        General: No deformity.  Cervical back: Neck supple.  Skin:    General: Skin is warm and dry.     Capillary Refill: Capillary refill takes less than 2 seconds.  Neurological:     Mental Status: He is alert and oriented to person, place, and time.     Coordination: Coordination normal.     Comments: Speech is clear, able to follow commands CN III-XII intact Normal strength in upper and lower extremities bilaterally including dorsiflexion and plantar flexion, strong and equal grip strength Sensation normal to light and sharp touch Moves extremities without ataxia, coordination intact  Psychiatric:        Mood and Affect: Mood normal.        Behavior: Behavior normal.    ED Course/Procedures   Labs Reviewed   BASIC METABOLIC PANEL - Abnormal; Notable for the following components:      Result Value   Calcium 8.8 (*)    All other components within normal limits  HEPATIC FUNCTION PANEL - Abnormal; Notable for the following components:   Total Protein 6.2 (*)    Indirect Bilirubin 0.2 (*)    All other components within normal limits  CBC WITH DIFFERENTIAL/PLATELET  LIPASE, BLOOD  TROPONIN I (HIGH SENSITIVITY)  TROPONIN I (HIGH SENSITIVITY)   EKG Interpretation  Date/Time:  Thursday August 13 2021 08:24:28 EDT Ventricular Rate:  63 PR Interval:  146 QRS Duration: 95 QT Interval:  432 QTC Calculation: 443 R Axis:   -2 Text Interpretation: Sinus rhythm ST elev, probable normal early repol pattern No old tracing to compare Confirmed by Rolan Bucco [phone]) on 08/13/2021 8:59:33 AM  DG Chest Port 1 View  Result Date: 08/13/2021 CLINICAL DATA:  Chest pain EXAM: PORTABLE CHEST 1 VIEW COMPARISON:  None. FINDINGS: Heart size upper normal.  Negative for heart failure. Mild bibasilar airspace disease.  No effusion.  Upper lobes clear. IMPRESSION: Mild bibasilar atelectasis/infiltrate. Electronically Signed   By: Marlan Palau M.D.   On: 08/13/2021 08:01      Procedures  MDM   Called to bedside by nursing, patient was discharged but emergency department awaiting transitions of care consult for medication assistance.  Once he was woken up to go over discharge paperwork he reported new complaints of shortness of breath and abdominal pain that is going into his chest.  Despite this he quickly goes back to sleep after speaking with nursing staff and myself and does not seem to be in acute distress.  Able to speak in full sentences, lungs clear and satting at 98%.  A abdomen with mild generalized tenderness that seems to improve with distraction, no peritoneal signs.   Additional work-up has been very reassuring EKG with sinus rhythm and some early repull, no significant ischemic changes, chest  x-ray with some bibasilar atelectasis, patient is not having any associated cough or fever and lungs are clear on auscultation, low suspicion for pneumonia.  Additional lab work has been reassuring, LFTs are within normal limits, lipase is normal and troponin is negative.  Patient continues to be well-appearing and in no acute distress.  Suspect there may be some element of secondary gain due to the symptoms did not develop until patient was being discharged.  Do not feel this warrants additional emergent work-up.  Transitions of care team has spoke with patient at this time he is stable for discharge home.   Dartha Lodge, PA-C 08/13/21 6045    Rolan Bucco, MD 08/13/21 [phone]

## 2021-08-14 ENCOUNTER — Emergency Department (HOSPITAL_COMMUNITY)
Admission: EM | Admit: 2021-08-14 | Discharge: 2021-08-14 | Disposition: A | Discharge status: Home / Self Care | Payer: Self-pay | Attending: Emergency Medicine | Admitting: Emergency Medicine

## 2021-08-14 ENCOUNTER — Other Ambulatory Visit: Payer: Self-pay

## 2021-08-14 ENCOUNTER — Emergency Department (HOSPITAL_COMMUNITY): Admission: EM | Admit: 2021-08-14 | Discharge: 2021-08-14 | Discharge status: Against Medical Advice | Payer: Self-pay

## 2021-08-14 DIAGNOSIS — F17210 Nicotine dependence, cigarettes, uncomplicated: Secondary | ICD-10-CM | POA: Insufficient documentation

## 2021-08-14 DIAGNOSIS — L03119 Cellulitis of unspecified part of limb: Secondary | ICD-10-CM

## 2021-08-14 DIAGNOSIS — L03116 Cellulitis of left lower limb: Secondary | ICD-10-CM | POA: Insufficient documentation

## 2021-08-14 MED ORDER — CEPHALEXIN 500 MG PO CAPS
500.0000 mg | ORAL_CAPSULE | Freq: Once | ORAL | Status: AC
  Administered 2021-08-14: 500 mg via ORAL
  Filled 2021-08-14: qty 1

## 2021-08-14 MED ORDER — CEPHALEXIN 500 MG PO CAPS
1000.0000 mg | ORAL_CAPSULE | Freq: Once | ORAL | Status: AC
  Administered 2021-08-14: 1000 mg via ORAL
  Filled 2021-08-14: qty 2

## 2021-08-14 NOTE — ED Triage Notes (Signed)
Pt c/o of foot pain, seen multiple times in the last 3 days for same. Seen and treated. After discharge, pt did not leave lobby. Continued to sleep in lobby for several hours, once asked to leave department, pt states he needed to check in for foot pain. No changes to condition.

## 2021-08-14 NOTE — ED Provider Notes (Signed)
[facility]-EMERGENCY DEPT Provider Note   CSN: [id number] Arrival date & time: 08/14/21  5427     History No chief complaint on file.   Zachary Kelley is a 40 y.o. male.  HPI     40 year old male comes in with chief complaint of leg pain.  [place], he picked up an infection in his foot.  He was given antibiotics and admitted to the hospital.  Subsequently he has not been able to fill the prescription because he does not have money, and the leg pain and swelling has persisted.  Patient's chart review reveals that he was discharged on 10 7 after receiving IV antibiotics starting 10 5.  Patient was transitioned to Keflex.  Subsequently, he has come to the ER on 10-11 and 10-12 with same complaint, given Keflex at each visit.  Social work is also seen the patient and he has been provided Media planner.  Patient does not want any further resources from [place].  Past Medical History:  Diagnosis Date   Medical history non-contributory     Patient Active Problem List   Diagnosis Date Noted   Cellulitis of right knee 08/06/2021   Left leg swelling    Cellulitis of left lower extremity 08/05/2021   Hypocalcemia 08/05/2021   Tobacco abuse 08/05/2021   Behavior concern in adult 08/05/2021    Past Surgical History:  Procedure Laterality Date   NO PAST SURGERIES         Family History  Problem Relation Age of Onset   Other Neg Hx     Social History   Tobacco Use   Smoking status: Every Day    Types: Cigarettes  Substance Use Topics   Alcohol use: Yes    Comment: Sometimes   Drug use: Not Currently    Home Medications Prior to Admission medications   Medication Sig Start Date End Date Taking? Authorizing Provider  acetaminophen (TYLENOL) 325 MG tablet Take 2 tablets (650 mg total) by mouth every 6 (six) hours as needed for mild pain (or Fever >/= 101). 08/07/21   Arrien, York Ram, MD  cephALEXin (KEFLEX) 500 MG capsule Take 1 capsule (500 mg total) by mouth 2 (two) times daily. 08/13/21   Roxy Horseman, PA-C  escitalopram (LEXAPRO) 5 MG tablet Take 1 tablet (5 mg total) by mouth daily. 08/08/21 09/07/21  Arrien, York Ram, MD  ibuprofen (ADVIL) 400 MG tablet Take 1 tablet (400 mg total) by mouth every 8 (eight) hours as needed (for pain). 08/07/21   Arrien, York Ram, MD  naloxone [facility]) nasal spray 4 mg/0.1 mL Use as directed per package instructions. 08/07/21   Arrien, York Ram, MD  neomycin-bacitracin-polymyxin (NEOSPORIN) OINT Apply 1 application topically 2 (two) times daily. 08/07/21   Arrien, York Ram, MD  traZODone (DESYREL) 50 MG tablet Take 1/2 tablet (25 mg total) by mouth at bedtime. 08/07/21 09/06/21  Arrien, York Ram, MD    Allergies    Patient has no known allergies.  Review of Systems   Review of Systems  Constitutional:  Positive for activity change.  Musculoskeletal:  Positive for myalgias. Negative for arthralgias.  Skin:  Positive for rash.  Allergic/Immunologic: Negative for immunocompromised state.   Physical Exam Updated Vital Signs BP 138/90 (BP Location: Right Arm)   Pulse 75   Temp 98.3 F (36.8 C) (Oral)   Resp 16   SpO2 100%   Physical Exam Vitals and  nursing note reviewed.  Constitutional:      Appearance: He is well-developed.  HENT:     Head: Atraumatic.  Cardiovascular:     Rate and Rhythm: Normal rate.  Pulmonary:     Effort: Pulmonary effort is normal.  Musculoskeletal:     Cervical back: Neck supple.  Skin:    General: Skin is warm.     Comments: Patient has slight erythema over his left toe.  He has no significant swelling around that and is able to flex and extend over his MTP. Patient also able to plantar and dorsiflex his left ankle without any difficulty.  Mild warmth to touch over the distal foot without any significant swelling.  Neurological:     Mental Status: He is  alert and oriented to person, place, and time.    ED Results / Procedures / Treatments   Labs (all labs ordered are listed, but only abnormal results are displayed) Labs Reviewed - No data to display  EKG None  Radiology DG Chest [facility] 1 View  Result Date: 08/13/2021 CLINICAL DATA:  Chest pain EXAM: PORTABLE CHEST 1 VIEW COMPARISON:  None. FINDINGS: Heart size upper normal.  Negative for heart failure. Mild bibasilar airspace disease.  No effusion.  Upper lobes clear. IMPRESSION: Mild bibasilar atelectasis/infiltrate. Electronically Signed   By: Marlan Palau M.D.   On: 08/13/2021 08:01    Procedures Procedures   Medications Ordered in ED Medications  cephALEXin (KEFLEX) capsule 500 mg (500 mg Oral Given 08/14/21 0759)  cephALEXin (KEFLEX) capsule 1,000 mg (1,000 mg Oral Provided for home use 08/14/21 0802)    ED Course  I have reviewed the triage vital signs and the nursing notes.  Pertinent labs & imaging results that were available during my care of the patient were reviewed by me and considered in my medical decision making (see chart for details).    MDM Rules/Calculators/A&P                           40 year old male comes in with chief complaint of leg pain.  It seems like he was admitted to the hospital for cellulitis and received IV antibiotics, transition to oral Keflex.  He was in the hospital for 2 days and has subsequently come to the ER on 2 separate visits where he has been given Keflex as well.  There is some evidence of erythema over the toe, but no clear evidence of any deep space infection, joint involvement.  No abscess appreciated either.  We will give him Keflex, with extra doses to cover him for the rest of the day.  He has prescription for Keflex, but states that he does not have resources to pay for it.  Social work has been notified about him and I have left a message to [place], who actually has seen the patient just yesterday and it appears that  patient was not eligible for any further assistance at that time.  Return precautions discussed with the patient.  Final Clinical Impression(s) / ED Diagnoses Final diagnoses:  Cellulitis of lower extremity, unspecified laterality    Rx / DC Orders ED Discharge Orders     None        Derwood Kaplan, MD 08/14/21 [phone]

## 2021-08-14 NOTE — Discharge Instructions (Addendum)
We have sent your information to our social worker - they will  contact you with assistance if it is available for antibiotics.  We have given you 2 extra tabs -1 for afternoon and 1 for night.  [place] [facility] may be able to assist as well.

## 2021-08-20 ENCOUNTER — Other Ambulatory Visit (HOSPITAL_COMMUNITY): Payer: Self-pay

## 2021-09-16 ENCOUNTER — Other Ambulatory Visit (HOSPITAL_BASED_OUTPATIENT_CLINIC_OR_DEPARTMENT_OTHER): Payer: Self-pay

## 2021-10-19 ENCOUNTER — Emergency Department (HOSPITAL_COMMUNITY)
Admission: EM | Admit: 2021-10-19 | Discharge: 2021-10-20 | Disposition: A | Discharge status: Home / Self Care | Payer: Self-pay | Attending: Emergency Medicine | Admitting: Emergency Medicine

## 2021-10-19 DIAGNOSIS — M25511 Pain in right shoulder: Secondary | ICD-10-CM | POA: Insufficient documentation

## 2021-10-19 DIAGNOSIS — F17210 Nicotine dependence, cigarettes, uncomplicated: Secondary | ICD-10-CM | POA: Insufficient documentation

## 2021-10-19 DIAGNOSIS — M79605 Pain in left leg: Secondary | ICD-10-CM | POA: Insufficient documentation

## 2021-10-19 DIAGNOSIS — Y92410 Unspecified street and highway as the place of occurrence of the external cause: Secondary | ICD-10-CM | POA: Insufficient documentation

## 2021-10-19 DIAGNOSIS — M549 Dorsalgia, unspecified: Secondary | ICD-10-CM | POA: Insufficient documentation

## 2021-10-19 DIAGNOSIS — M79604 Pain in right leg: Secondary | ICD-10-CM | POA: Insufficient documentation

## 2021-10-19 DIAGNOSIS — W19XXXA Unspecified fall, initial encounter: Secondary | ICD-10-CM

## 2021-10-19 DIAGNOSIS — S82831A Other fracture of upper and lower end of right fibula, initial encounter for closed fracture: Secondary | ICD-10-CM | POA: Insufficient documentation

## 2021-10-20 ENCOUNTER — Other Ambulatory Visit: Payer: Self-pay

## 2021-10-20 ENCOUNTER — Emergency Department (HOSPITAL_COMMUNITY): Payer: Self-pay

## 2021-10-20 ENCOUNTER — Encounter (HOSPITAL_COMMUNITY): Payer: Self-pay | Admitting: Emergency Medicine

## 2021-10-20 MED ORDER — IBUPROFEN 400 MG PO TABS
400.0000 mg | ORAL_TABLET | Freq: Once | ORAL | Status: AC
  Administered 2021-10-20: 10:00:00 400 mg via ORAL
  Filled 2021-10-20: qty 1

## 2021-10-20 MED ORDER — ACETAMINOPHEN 500 MG PO TABS
1000.0000 mg | ORAL_TABLET | Freq: Once | ORAL | Status: AC
  Administered 2021-10-20: 10:00:00 1000 mg via ORAL
  Filled 2021-10-20: qty 2
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
## 2021-10-20 NOTE — ED Notes (Addendum)
Pt given crutched and taught how to use. Stated he didn't want crutches or brace due to having to  "walk too much"
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
## 2021-10-20 NOTE — ED Notes (Signed)
Patient called 3 x.

## 2021-10-20 NOTE — ED Notes (Signed)
Pt given snack pack 

## 2021-10-20 NOTE — Discharge Instructions (Signed)
You have a fracture of the fibula which is a nonweightbearing bone in your leg.  This is what is causing the pain in your right knee.  Please wear the knee brace I have provided to you for comfort this will also provide some support, use crutches that I have given you.  Take Tylenol and ibuprofen for pain as discussed below.  Ice and elevate your leg as well for swelling and pain.  Follow-up with orthopedic doctors at [facility].  I have given you their information as well.   Please use Tylenol or ibuprofen for pain.  You may use 600 mg ibuprofen every 6 hours or 1000 mg of Tylenol every 6 hours.  You may choose to alternate between the 2.  This would be most effective.  Not to exceed 4 g of Tylenol within 24 hours.  Not to exceed 3200 mg ibuprofen 24 hours.

## 2021-10-21 ENCOUNTER — Encounter (HOSPITAL_COMMUNITY): Payer: Self-pay | Admitting: Emergency Medicine

## 2021-10-21 ENCOUNTER — Emergency Department (HOSPITAL_COMMUNITY): Payer: Self-pay

## 2021-10-21 ENCOUNTER — Emergency Department (HOSPITAL_COMMUNITY)
Admission: EM | Admit: 2021-10-21 | Discharge: 2021-10-21 | Disposition: A | Discharge status: Home / Self Care | Payer: Self-pay | Source: Home / Self Care | Attending: Emergency Medicine | Admitting: Emergency Medicine

## 2021-10-21 ENCOUNTER — Encounter (HOSPITAL_COMMUNITY): Payer: Self-pay

## 2021-10-21 ENCOUNTER — Other Ambulatory Visit: Payer: Self-pay

## 2021-10-21 ENCOUNTER — Emergency Department (HOSPITAL_COMMUNITY)
Admission: EM | Admit: 2021-10-21 | Discharge: 2021-10-21 | Disposition: A | Discharge status: Home / Self Care | Payer: Self-pay | Attending: Emergency Medicine | Admitting: Emergency Medicine

## 2021-10-21 DIAGNOSIS — M79605 Pain in left leg: Secondary | ICD-10-CM | POA: Insufficient documentation

## 2021-10-21 DIAGNOSIS — M79661 Pain in right lower leg: Secondary | ICD-10-CM | POA: Insufficient documentation

## 2021-10-21 DIAGNOSIS — Y92522 Railway station as the place of occurrence of the external cause: Secondary | ICD-10-CM | POA: Diagnosis not present

## 2021-10-21 DIAGNOSIS — R4182 Altered mental status, unspecified: Secondary | ICD-10-CM | POA: Insufficient documentation

## 2021-10-21 DIAGNOSIS — M79604 Pain in right leg: Secondary | ICD-10-CM

## 2021-10-21 DIAGNOSIS — R109 Unspecified abdominal pain: Secondary | ICD-10-CM | POA: Insufficient documentation

## 2021-10-21 DIAGNOSIS — Y92410 Unspecified street and highway as the place of occurrence of the external cause: Secondary | ICD-10-CM | POA: Insufficient documentation

## 2021-10-21 DIAGNOSIS — F17210 Nicotine dependence, cigarettes, uncomplicated: Secondary | ICD-10-CM | POA: Insufficient documentation

## 2021-10-21 DIAGNOSIS — M25512 Pain in left shoulder: Secondary | ICD-10-CM | POA: Diagnosis not present

## 2021-10-21 MED ORDER — HYDROCODONE-ACETAMINOPHEN 5-325 MG PO TABS: 1.0000 | ORAL_TABLET | ORAL | 0 refills | Status: DC | PRN

## 2021-10-21 MED ORDER — IBUPROFEN 400 MG PO TABS
600.0000 mg | ORAL_TABLET | Freq: Once | ORAL | Status: AC
  Administered 2021-10-21: 10:00:00 600 mg via ORAL
  Filled 2021-10-21: qty 1

## 2021-10-21 MED ORDER — ACETAMINOPHEN 500 MG PO TABS
1000.0000 mg | ORAL_TABLET | Freq: Once | ORAL | Status: AC
  Administered 2021-10-21: 15:00:00 1000 mg via ORAL
  Filled 2021-10-21: qty 2

## 2021-10-21 MED ORDER — IOHEXOL 300 MG/ML  SOLN
80.0000 mL | Freq: Once | INTRAMUSCULAR | Status: AC | PRN
  Administered 2021-10-21: 08:00:00 80 mL via INTRAVENOUS

## 2021-10-21 MED ORDER — HYDROCODONE-ACETAMINOPHEN 5-325 MG PO TABS
1.0000 | ORAL_TABLET | Freq: Once | ORAL | Status: AC
  Administered 2021-10-21: 11:00:00 1 via ORAL
  Filled 2021-10-21: qty 1

## 2021-10-21 MED ORDER — IBUPROFEN 800 MG PO TABS
800.0000 mg | ORAL_TABLET | Freq: Once | ORAL | Status: AC
  Administered 2021-10-21: 15:00:00 800 mg via ORAL
  Filled 2021-10-21: qty 1
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
## 2021-10-21 NOTE — ED Notes (Signed)
Pt refused CAM boot

## 2021-10-21 NOTE — ED Provider Notes (Addendum)
Care transferred to me. Patient does not appear to have any significant traumatic injury. He is able to ambulate, though he states it's painful. Chart review shows he had an xray yesterday that showed a proximal fibula fracture.  I think a cam boot would be better than the knee brace.  We will also make sure he gets crutches.  Otherwise I will give a short course of hydrocodone.  There is questionable findings on his CT cervical spine but he is freely moving his neck and I highly doubt acute ligamentous injury.  At this point he appears stable for discharge and does not appear clinically intoxicated.   Pricilla Loveless, MD 10/21/21 1027  Of note, after complaining that he could barely walk due to severe pain, patient refused CAM Walker, crutches, and was ambulating without difficulty as he left the emergency department.   Pricilla Loveless, MD 10/21/21 [phone]
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
## 2021-10-21 NOTE — ED Notes (Signed)
Resting quietly nad noted, resp equal and non labored

## 2021-10-21 NOTE — ED Triage Notes (Signed)
Pt seen her earlier today and discharged with prescription for pain medication. Pt states he cannot afford to get the medication filled.

## 2021-10-21 NOTE — ED Notes (Signed)
Radiology at bedside

## 2021-10-21 NOTE — Discharge Instructions (Addendum)
Your CAT scans and x-rays today did not show any trauma.  However your x-ray yesterday did show a fracture near your knee joint on the right leg.  You are being given a boot for this and need to follow-up with orthopedics.  A prescription for hydrocodone has been sent to your pharmacy.  You may also take Tylenol and ibuprofen and apply ice.

## 2021-10-22 ENCOUNTER — Emergency Department (HOSPITAL_COMMUNITY)
Admission: EM | Admit: 2021-10-22 | Discharge: 2021-10-22 | Disposition: A | Discharge status: Home / Self Care | Payer: Self-pay | Attending: Emergency Medicine | Admitting: Emergency Medicine

## 2021-10-22 ENCOUNTER — Emergency Department (HOSPITAL_COMMUNITY): Payer: Self-pay

## 2021-10-22 ENCOUNTER — Encounter (HOSPITAL_COMMUNITY): Payer: Self-pay | Admitting: *Deleted

## 2021-10-22 DIAGNOSIS — R519 Headache, unspecified: Secondary | ICD-10-CM | POA: Insufficient documentation

## 2021-10-22 DIAGNOSIS — F17210 Nicotine dependence, cigarettes, uncomplicated: Secondary | ICD-10-CM | POA: Diagnosis not present

## 2021-10-22 DIAGNOSIS — F10920 Alcohol use, unspecified with intoxication, uncomplicated: Secondary | ICD-10-CM | POA: Diagnosis not present

## 2021-10-22 DIAGNOSIS — R109 Unspecified abdominal pain: Secondary | ICD-10-CM | POA: Diagnosis not present

## 2021-10-22 DIAGNOSIS — S82831D Other fracture of upper and lower end of right fibula, subsequent encounter for closed fracture with routine healing: Secondary | ICD-10-CM | POA: Insufficient documentation

## 2021-10-22 DIAGNOSIS — T1490XA Injury, unspecified, initial encounter: Secondary | ICD-10-CM

## 2021-10-22 DIAGNOSIS — S8991XA Unspecified injury of right lower leg, initial encounter: Secondary | ICD-10-CM | POA: Diagnosis present

## 2021-10-22 DIAGNOSIS — Y92410 Unspecified street and highway as the place of occurrence of the external cause: Secondary | ICD-10-CM | POA: Insufficient documentation

## 2021-10-22 DIAGNOSIS — Z23 Encounter for immunization: Secondary | ICD-10-CM | POA: Diagnosis not present

## 2021-10-22 DIAGNOSIS — S0081XD Abrasion of other part of head, subsequent encounter: Secondary | ICD-10-CM | POA: Insufficient documentation

## 2021-10-22 LAB — I-STAT CHEM 8, ED
BUN: 12 mg/dL (ref 6–20)
Calcium, Ion: 1.1 mmol/L — ABNORMAL LOW (ref 1.15–1.40)
Chloride: 106 mmol/L (ref 98–111)
Creatinine, Ser: 1.2 mg/dL (ref 0.61–1.24)
Glucose, Bld: 90 mg/dL (ref 70–99)
HCT: 35 % — ABNORMAL LOW (ref 39.0–52.0)
Hemoglobin: 11.9 g/dL — ABNORMAL LOW (ref 13.0–17.0)
Potassium: 3.7 mmol/L (ref 3.5–5.1)
Sodium: 143 mmol/L (ref 135–145)
TCO2: 25 mmol/L (ref 22–32)

## 2021-10-22 LAB — COMPREHENSIVE METABOLIC PANEL
ALT: 16 U/L (ref 0–44)
AST: 26 U/L (ref 15–41)
Albumin: 3.5 g/dL (ref 3.5–5.0)
Alkaline Phosphatase: 59 U/L (ref 38–126)
Anion gap: 7 (ref 5–15)
BUN: 10 mg/dL (ref 6–20)
CO2: 24 mmol/L (ref 22–32)
Calcium: 8 mg/dL — ABNORMAL LOW (ref 8.9–10.3)
Chloride: 106 mmol/L (ref 98–111)
Creatinine, Ser: 0.86 mg/dL (ref 0.61–1.24)
GFR, Estimated: >60 mL/min (ref >60)
Glucose, Bld: 95 mg/dL (ref 70–99)
Potassium: 3.6 mmol/L (ref 3.5–5.1)
Sodium: 137 mmol/L (ref 135–145)
Total Bilirubin: 0.2 mg/dL — ABNORMAL LOW (ref 0.3–1.2)
Total Protein: 5.8 g/dL — ABNORMAL LOW (ref 6.5–8.1)

## 2021-10-22 LAB — LACTIC ACID, PLASMA: Lactic Acid, Venous: 2.2 mmol/L (ref 0.5–1.9)

## 2021-10-22 LAB — CBC
HCT: 36.7 % — ABNORMAL LOW (ref 39.0–52.0)
Hemoglobin: 12.2 g/dL — ABNORMAL LOW (ref 13.0–17.0)
MCH: 30.3 pg (ref 26.0–34.0)
MCHC: 33.2 g/dL (ref 30.0–36.0)
MCV: 91.3 fL (ref 80.0–100.0)
Platelets: 288 10*3/uL (ref 150–400)
RBC: 4.02 MIL/uL — ABNORMAL LOW (ref 4.22–5.81)
RDW: 12.8 % (ref 11.5–15.5)
WBC: 7.3 10*3/uL (ref 4.0–10.5)
nRBC: 0 % (ref 0.0–0.2)

## 2021-10-22 LAB — PROTIME-INR
INR: 0.9 (ref 0.8–1.2)
Prothrombin Time: 11.7 seconds (ref 11.4–15.2)

## 2021-10-22 LAB — ETHANOL: Alcohol, Ethyl (B): 206 mg/dL — ABNORMAL HIGH (ref <10)

## 2021-10-22 MED ORDER — TETANUS-DIPHTH-ACELL PERTUSSIS 5-2.5-18.5 LF-MCG/0.5 IM SUSY
0.5000 mL | PREFILLED_SYRINGE | Freq: Once | INTRAMUSCULAR | Status: AC
  Administered 2021-10-22: 05:00:00 0.5 mL via INTRAMUSCULAR
  Filled 2021-10-22: qty 0.5

## 2021-10-22 MED ORDER — IOHEXOL 350 MG/ML SOLN
100.0000 mL | Freq: Once | INTRAVENOUS | Status: AC | PRN
  Administered 2021-10-22: 05:00:00 100 mL via INTRAVENOUS

## 2021-10-22 NOTE — ED Notes (Signed)
Portable XRAYS completed prior to CT

## 2021-10-22 NOTE — ED Notes (Signed)
Attempted to provide patient with crutches; pt refused crutches. Ambulatory with steady gait.
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
## 2021-10-27 ENCOUNTER — Emergency Department (HOSPITAL_COMMUNITY)
Admission: EM | Admit: 2021-10-27 | Discharge: 2021-10-28 | Discharge status: Against Medical Advice | Payer: Self-pay | Attending: Emergency Medicine | Admitting: Emergency Medicine

## 2021-10-27 ENCOUNTER — Encounter (HOSPITAL_COMMUNITY): Payer: Self-pay

## 2021-10-27 ENCOUNTER — Other Ambulatory Visit: Payer: Self-pay

## 2021-10-27 DIAGNOSIS — M79604 Pain in right leg: Secondary | ICD-10-CM | POA: Insufficient documentation

## 2021-10-27 DIAGNOSIS — M79605 Pain in left leg: Secondary | ICD-10-CM | POA: Insufficient documentation

## 2021-10-27 DIAGNOSIS — Z5321 Procedure and treatment not carried out due to patient leaving prior to being seen by health care provider: Secondary | ICD-10-CM | POA: Insufficient documentation
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
## 2021-10-28 ENCOUNTER — Emergency Department (HOSPITAL_COMMUNITY): Payer: Self-pay

## 2021-10-28 ENCOUNTER — Emergency Department (HOSPITAL_COMMUNITY)
Admission: EM | Admit: 2021-10-28 | Discharge: 2021-10-28 | Disposition: A | Discharge status: Home / Self Care | Payer: Self-pay | Attending: Emergency Medicine | Admitting: Emergency Medicine

## 2021-10-28 ENCOUNTER — Encounter (HOSPITAL_COMMUNITY): Payer: Self-pay | Admitting: Emergency Medicine

## 2021-10-28 DIAGNOSIS — S01511A Laceration without foreign body of lip, initial encounter: Secondary | ICD-10-CM | POA: Insufficient documentation

## 2021-10-28 DIAGNOSIS — R220 Localized swelling, mass and lump, head: Secondary | ICD-10-CM | POA: Insufficient documentation

## 2021-10-28 DIAGNOSIS — S86912A Strain of unspecified muscle(s) and tendon(s) at lower leg level, left leg, initial encounter: Secondary | ICD-10-CM | POA: Insufficient documentation

## 2021-10-28 DIAGNOSIS — S00531A Contusion of lip, initial encounter: Secondary | ICD-10-CM

## 2021-10-28 DIAGNOSIS — F17210 Nicotine dependence, cigarettes, uncomplicated: Secondary | ICD-10-CM | POA: Insufficient documentation

## 2021-10-28 DIAGNOSIS — Y92410 Unspecified street and highway as the place of occurrence of the external cause: Secondary | ICD-10-CM | POA: Insufficient documentation

## 2021-10-28 DIAGNOSIS — Y9301 Activity, walking, marching and hiking: Secondary | ICD-10-CM | POA: Insufficient documentation

## 2021-10-28 MED ORDER — IBUPROFEN 600 MG PO TABS: 600.0000 mg | ORAL_TABLET | Freq: Four times a day (QID) | ORAL | 0 refills | Status: DC | PRN

## 2021-10-28 MED ORDER — CYCLOBENZAPRINE HCL 10 MG PO TABS: 10.0000 mg | ORAL_TABLET | Freq: Two times a day (BID) | ORAL | 0 refills | Status: DC | PRN

## 2021-10-28 MED ORDER — IBUPROFEN 800 MG PO TABS
800.0000 mg | ORAL_TABLET | Freq: Once | ORAL | Status: AC
  Administered 2021-10-28: 10:00:00 800 mg via ORAL
  Filled 2021-10-28: qty 1

## 2021-10-28 NOTE — ED Notes (Signed)
Pt had to be encouraged multiple times to walk. Pt eventually stood up without any staff assistance and was able to maintain steady and equal gait.

## 2021-10-28 NOTE — ED Notes (Signed)
I instructed the pt to undress into a hospital gown for provider assessment. Pt is aware.
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
## 2021-10-28 NOTE — ED Notes (Signed)
Pt ambulatory without assistance.  

## 2021-10-28 NOTE — ED Notes (Signed)
Pt ambulated without assistance
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
## 2021-10-28 NOTE — ED Notes (Signed)
New vitals obtained. Pt refuses to walk or attempt to walk. Pt states he cannot walk.

## 2021-10-29 ENCOUNTER — Encounter (HOSPITAL_COMMUNITY): Payer: Self-pay

## 2021-10-29 ENCOUNTER — Emergency Department (HOSPITAL_COMMUNITY)
Admission: EM | Admit: 2021-10-29 | Discharge: 2021-10-30 | Disposition: A | Discharge status: Home / Self Care | Payer: Self-pay | Attending: Emergency Medicine | Admitting: Emergency Medicine

## 2021-10-29 ENCOUNTER — Emergency Department (HOSPITAL_COMMUNITY): Payer: Self-pay

## 2021-10-29 DIAGNOSIS — S01511A Laceration without foreign body of lip, initial encounter: Secondary | ICD-10-CM | POA: Insufficient documentation

## 2021-10-29 DIAGNOSIS — M25561 Pain in right knee: Secondary | ICD-10-CM | POA: Insufficient documentation

## 2021-10-29 DIAGNOSIS — F17210 Nicotine dependence, cigarettes, uncomplicated: Secondary | ICD-10-CM | POA: Insufficient documentation

## 2021-10-29 DIAGNOSIS — S82831A Other fracture of upper and lower end of right fibula, initial encounter for closed fracture: Secondary | ICD-10-CM | POA: Insufficient documentation
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
## 2021-10-30 ENCOUNTER — Other Ambulatory Visit: Payer: Self-pay

## 2021-10-30 ENCOUNTER — Emergency Department (HOSPITAL_COMMUNITY): Payer: Self-pay

## 2021-10-30 ENCOUNTER — Encounter (HOSPITAL_COMMUNITY): Payer: Self-pay

## 2021-10-30 ENCOUNTER — Emergency Department (HOSPITAL_COMMUNITY)
Admission: EM | Admit: 2021-10-30 | Discharge: 2021-10-30 | Disposition: A | Discharge status: Home / Self Care | Payer: Self-pay | Attending: Emergency Medicine | Admitting: Emergency Medicine

## 2021-10-30 DIAGNOSIS — F17210 Nicotine dependence, cigarettes, uncomplicated: Secondary | ICD-10-CM | POA: Insufficient documentation

## 2021-10-30 DIAGNOSIS — S82831D Other fracture of upper and lower end of right fibula, subsequent encounter for closed fracture with routine healing: Secondary | ICD-10-CM

## 2021-10-30 DIAGNOSIS — Z765 Malingerer [conscious simulation]: Secondary | ICD-10-CM

## 2021-10-30 DIAGNOSIS — S82101D Unspecified fracture of upper end of right tibia, subsequent encounter for closed fracture with routine healing: Secondary | ICD-10-CM | POA: Insufficient documentation
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
## 2021-11-01 ENCOUNTER — Emergency Department (HOSPITAL_COMMUNITY)
Admission: EM | Admit: 2021-11-01 | Discharge: 2021-11-01 | Disposition: A | Discharge status: Home / Self Care | Payer: Self-pay | Attending: Emergency Medicine | Admitting: Emergency Medicine

## 2021-11-01 ENCOUNTER — Other Ambulatory Visit: Payer: Self-pay

## 2021-11-01 ENCOUNTER — Encounter (HOSPITAL_COMMUNITY): Payer: Self-pay

## 2021-11-01 ENCOUNTER — Emergency Department (HOSPITAL_COMMUNITY): Admission: EM | Admit: 2021-11-01 | Discharge: 2021-11-01 | Discharge status: Against Medical Advice | Payer: Self-pay

## 2021-11-01 DIAGNOSIS — M25561 Pain in right knee: Secondary | ICD-10-CM | POA: Insufficient documentation

## 2021-11-01 DIAGNOSIS — Z5189 Encounter for other specified aftercare: Secondary | ICD-10-CM

## 2021-11-01 DIAGNOSIS — S01511A Laceration without foreign body of lip, initial encounter: Secondary | ICD-10-CM | POA: Insufficient documentation

## 2021-11-01 NOTE — ED Notes (Signed)
Pt is states that his name is not Zachary Kelley and to leave him alone.   Pt armband says Zachary Kelley.  Pt is also wearing the same clothes as his registration photo.

## 2021-11-01 NOTE — Discharge Instructions (Signed)
Use the mupirocen as directed  Follow up with orthopedics as directed on your prior visits  Please follow up with your primary care provider within 5-7 days for re-evaluation of your symptoms. If you do not have a primary care provider, information for a healthcare clinic has been provided for you to make arrangements for follow up care. Please return to the emergency department for any new or worsening symptoms.
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
## 2021-11-01 NOTE — ED Notes (Signed)
Pt refuses triage. He states that he does not want to be seen.
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
## 2021-11-08 ENCOUNTER — Other Ambulatory Visit: Payer: Self-pay

## 2021-11-08 ENCOUNTER — Emergency Department (HOSPITAL_COMMUNITY)
Admission: EM | Admit: 2021-11-08 | Discharge: 2021-11-08 | Disposition: A | Discharge status: Home / Self Care | Payer: Self-pay | Attending: Emergency Medicine | Admitting: Emergency Medicine

## 2021-11-08 DIAGNOSIS — W19XXXA Unspecified fall, initial encounter: Secondary | ICD-10-CM | POA: Insufficient documentation

## 2021-11-08 DIAGNOSIS — Z5321 Procedure and treatment not carried out due to patient leaving prior to being seen by health care provider: Secondary | ICD-10-CM | POA: Insufficient documentation

## 2021-11-08 DIAGNOSIS — F10129 Alcohol abuse with intoxication, unspecified: Secondary | ICD-10-CM | POA: Insufficient documentation

## 2021-11-08 DIAGNOSIS — S0031XA Abrasion of nose, initial encounter: Secondary | ICD-10-CM | POA: Insufficient documentation
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
## 2021-11-08 NOTE — ED Notes (Signed)
Pt walked out of triage. Nurse and tech tired to bring pt back. Pt did not want to be seen

## 2021-11-10 ENCOUNTER — Emergency Department (HOSPITAL_COMMUNITY)
Admission: EM | Admit: 2021-11-10 | Discharge: 2021-11-11 | Disposition: A | Discharge status: Home / Self Care | Payer: Self-pay | Attending: Emergency Medicine | Admitting: Emergency Medicine

## 2021-11-10 ENCOUNTER — Emergency Department (HOSPITAL_COMMUNITY)
Admission: EM | Admit: 2021-11-10 | Discharge: 2021-11-10 | Disposition: A | Discharge status: Home / Self Care | Payer: Self-pay | Attending: Emergency Medicine | Admitting: Emergency Medicine

## 2021-11-10 ENCOUNTER — Emergency Department (HOSPITAL_COMMUNITY): Payer: Self-pay

## 2021-11-10 ENCOUNTER — Encounter (HOSPITAL_COMMUNITY): Payer: Self-pay

## 2021-11-10 DIAGNOSIS — G8929 Other chronic pain: Secondary | ICD-10-CM | POA: Insufficient documentation

## 2021-11-10 DIAGNOSIS — G8911 Acute pain due to trauma: Secondary | ICD-10-CM | POA: Insufficient documentation

## 2021-11-10 DIAGNOSIS — K122 Cellulitis and abscess of mouth: Secondary | ICD-10-CM | POA: Insufficient documentation

## 2021-11-10 DIAGNOSIS — M25561 Pain in right knee: Secondary | ICD-10-CM | POA: Insufficient documentation

## 2021-11-10 DIAGNOSIS — K130 Diseases of lips: Secondary | ICD-10-CM

## 2021-11-10 DIAGNOSIS — S8002XA Contusion of left knee, initial encounter: Secondary | ICD-10-CM | POA: Insufficient documentation

## 2021-11-10 MED ORDER — DOXYCYCLINE HYCLATE 100 MG PO CAPS: 100.0000 mg | ORAL_CAPSULE | Freq: Two times a day (BID) | ORAL | 0 refills | Status: DC

## 2021-11-10 MED ORDER — DOXYCYCLINE HYCLATE 100 MG PO TABS
100.0000 mg | ORAL_TABLET | Freq: Once | ORAL | Status: AC
  Administered 2021-11-10: 100 mg via ORAL
  Filled 2021-11-10: qty 1
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
## 2021-11-11 ENCOUNTER — Other Ambulatory Visit: Payer: Self-pay

## 2021-11-11 ENCOUNTER — Emergency Department (HOSPITAL_COMMUNITY): Admission: EM | Admit: 2021-11-11 | Discharge: 2021-11-12 | Discharge status: Against Medical Advice | Payer: Self-pay
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
## 2021-11-11 NOTE — ED Notes (Signed)
Patient called for room. No response in waiting. Will call again soon.
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
## 2021-11-11 NOTE — Discharge Instructions (Signed)
Use tylenol as needed for pain

## 2021-11-12 ENCOUNTER — Emergency Department (HOSPITAL_COMMUNITY)
Admission: EM | Admit: 2021-11-12 | Discharge: 2021-11-12 | Disposition: A | Discharge status: Home / Self Care | Payer: Self-pay | Attending: Emergency Medicine | Admitting: Emergency Medicine

## 2021-11-12 DIAGNOSIS — Z5900 Homelessness unspecified: Secondary | ICD-10-CM | POA: Insufficient documentation

## 2021-11-12 DIAGNOSIS — Z765 Malingerer [conscious simulation]: Secondary | ICD-10-CM | POA: Insufficient documentation

## 2021-11-12 DIAGNOSIS — S82831G Other fracture of upper and lower end of right fibula, subsequent encounter for closed fracture with delayed healing: Secondary | ICD-10-CM | POA: Insufficient documentation

## 2021-11-12 DIAGNOSIS — K130 Diseases of lips: Secondary | ICD-10-CM | POA: Insufficient documentation

## 2021-11-12 DIAGNOSIS — Y92410 Unspecified street and highway as the place of occurrence of the external cause: Secondary | ICD-10-CM | POA: Insufficient documentation

## 2021-11-12 MED ORDER — DOXYCYCLINE HYCLATE 100 MG PO TABS
100.0000 mg | ORAL_TABLET | Freq: Once | ORAL | Status: AC
  Administered 2021-11-12: 100 mg via ORAL
  Filled 2021-11-12: qty 1
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
## 2021-11-12 NOTE — ED Notes (Signed)
Called for triage x4 
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
## 2021-11-12 NOTE — ED Notes (Signed)
Called pt 3x, no response.  

## 2021-11-13 ENCOUNTER — Emergency Department (HOSPITAL_COMMUNITY)
Admission: EM | Admit: 2021-11-13 | Discharge: 2021-11-13 | Disposition: A | Discharge status: Home / Self Care | Payer: Self-pay | Attending: Emergency Medicine | Admitting: Emergency Medicine

## 2021-11-13 DIAGNOSIS — M79604 Pain in right leg: Secondary | ICD-10-CM | POA: Insufficient documentation

## 2021-11-13 NOTE — Discharge Instructions (Signed)
You need to follow-up with orthopedic doctor listed below for further evaluation management of your continued leg pain.
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
## 2021-12-09 ENCOUNTER — Emergency Department (HOSPITAL_COMMUNITY)
Admission: EM | Admit: 2021-12-09 | Discharge: 2021-12-09 | Disposition: A | Discharge status: Home / Self Care | Payer: Self-pay | Attending: Emergency Medicine | Admitting: Emergency Medicine

## 2021-12-09 ENCOUNTER — Encounter (HOSPITAL_COMMUNITY): Payer: Self-pay

## 2021-12-09 DIAGNOSIS — G8929 Other chronic pain: Secondary | ICD-10-CM | POA: Insufficient documentation

## 2021-12-09 DIAGNOSIS — M25561 Pain in right knee: Secondary | ICD-10-CM | POA: Insufficient documentation

## 2021-12-09 DIAGNOSIS — Y92481 Parking lot as the place of occurrence of the external cause: Secondary | ICD-10-CM | POA: Insufficient documentation

## 2021-12-09 MED ORDER — IBUPROFEN 200 MG PO TABS: 600.0000 mg | ORAL_TABLET | Freq: Once | ORAL | Status: DC
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
## 2021-12-10 ENCOUNTER — Emergency Department (HOSPITAL_COMMUNITY)
Admission: EM | Admit: 2021-12-10 | Discharge: 2021-12-10 | Disposition: A | Discharge status: Home / Self Care | Payer: Self-pay | Attending: Emergency Medicine | Admitting: Emergency Medicine

## 2021-12-10 DIAGNOSIS — Z5321 Procedure and treatment not carried out due to patient leaving prior to being seen by health care provider: Secondary | ICD-10-CM | POA: Insufficient documentation

## 2021-12-10 DIAGNOSIS — M25561 Pain in right knee: Secondary | ICD-10-CM | POA: Insufficient documentation

## 2021-12-10 DIAGNOSIS — M25562 Pain in left knee: Secondary | ICD-10-CM | POA: Insufficient documentation

## 2021-12-10 NOTE — ED Notes (Signed)
Pt didn't answered in lobby

## 2021-12-10 NOTE — ED Triage Notes (Signed)
Please see downtime charting for patient information from arrival through 0630 am
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
## 2022-01-14 ENCOUNTER — Emergency Department (HOSPITAL_COMMUNITY)
Admission: EM | Admit: 2022-01-14 | Discharge: 2022-01-14 | Disposition: A | Discharge status: Home / Self Care | Payer: Self-pay | Attending: Emergency Medicine | Admitting: Emergency Medicine

## 2022-01-14 ENCOUNTER — Other Ambulatory Visit: Payer: Self-pay

## 2022-01-14 ENCOUNTER — Emergency Department (HOSPITAL_COMMUNITY): Payer: Self-pay

## 2022-01-14 DIAGNOSIS — M25561 Pain in right knee: Secondary | ICD-10-CM | POA: Insufficient documentation

## 2022-01-14 NOTE — ED Notes (Signed)
Pt taken to xray then to be taken to H20 ?
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
## 2022-01-14 NOTE — Discharge Instructions (Signed)
You have been seen here for knee pain, I recommend taking over-the-counter pain medications like ibuprofen and/or Tylenol every 6 as needed.  Please follow dosage and on the back of bottle.  I also recommend applying heat to the area and stretching out the muscles as this will help decrease stiffness and pain.   ? ?Please follow-up with PCP for further evaluation ? ?Come back to the emergency department if you develop chest pain, shortness of breath, severe abdominal pain, uncontrolled nausea, vomiting, diarrhea. ? ?
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
## 2022-01-15 ENCOUNTER — Emergency Department (HOSPITAL_COMMUNITY)
Admission: EM | Admit: 2022-01-15 | Discharge: 2022-01-15 | Disposition: A | Discharge status: Home / Self Care | Payer: Self-pay | Attending: Emergency Medicine | Admitting: Emergency Medicine

## 2022-01-15 ENCOUNTER — Other Ambulatory Visit: Payer: Self-pay

## 2022-01-15 ENCOUNTER — Encounter (HOSPITAL_COMMUNITY): Payer: Self-pay | Admitting: Emergency Medicine

## 2022-01-15 DIAGNOSIS — S8001XA Contusion of right knee, initial encounter: Secondary | ICD-10-CM | POA: Insufficient documentation

## 2022-01-15 DIAGNOSIS — W2111XA Struck by baseball bat, initial encounter: Secondary | ICD-10-CM | POA: Insufficient documentation

## 2022-01-15 DIAGNOSIS — M25561 Pain in right knee: Secondary | ICD-10-CM
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
## 2022-01-15 NOTE — Discharge Instructions (Addendum)
Please get in touch with the primary care provider.  When you are able, there is a phone number for community care clinic on this paperwork. ? ?Ibuprofen and Tylenol are good options for your pain. ?
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
## 2022-01-16 ENCOUNTER — Emergency Department (HOSPITAL_COMMUNITY)
Admission: EM | Admit: 2022-01-16 | Discharge: 2022-01-16 | Disposition: A | Discharge status: Home / Self Care | Payer: Self-pay | Attending: Emergency Medicine | Admitting: Emergency Medicine

## 2022-01-16 ENCOUNTER — Other Ambulatory Visit: Payer: Self-pay

## 2022-01-16 DIAGNOSIS — S8001XA Contusion of right knee, initial encounter: Secondary | ICD-10-CM | POA: Insufficient documentation

## 2022-01-16 DIAGNOSIS — S8001XD Contusion of right knee, subsequent encounter: Secondary | ICD-10-CM

## 2022-01-16 MED ORDER — ACETAMINOPHEN 500 MG PO TABS
1000.0000 mg | ORAL_TABLET | Freq: Once | ORAL | Status: AC
  Administered 2022-01-16: 1000 mg via ORAL
  Filled 2022-01-16: qty 2
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
## 2022-02-01 ENCOUNTER — Emergency Department (HOSPITAL_COMMUNITY)
Admission: EM | Admit: 2022-02-01 | Discharge: 2022-02-01 | Disposition: A | Discharge status: Home / Self Care | Payer: Self-pay | Attending: Emergency Medicine | Admitting: Emergency Medicine

## 2022-02-01 ENCOUNTER — Emergency Department (HOSPITAL_COMMUNITY): Payer: Self-pay

## 2022-02-01 ENCOUNTER — Encounter (HOSPITAL_COMMUNITY): Payer: Self-pay | Admitting: Emergency Medicine

## 2022-02-01 ENCOUNTER — Other Ambulatory Visit: Payer: Self-pay

## 2022-02-01 DIAGNOSIS — Z5900 Homelessness unspecified: Secondary | ICD-10-CM | POA: Insufficient documentation

## 2022-02-01 DIAGNOSIS — M25562 Pain in left knee: Secondary | ICD-10-CM | POA: Insufficient documentation

## 2022-02-01 DIAGNOSIS — M25561 Pain in right knee: Secondary | ICD-10-CM | POA: Insufficient documentation

## 2022-02-01 MED ORDER — ACETAMINOPHEN 325 MG PO TABS
650.0000 mg | ORAL_TABLET | Freq: Once | ORAL | Status: AC
  Administered 2022-02-01: 650 mg via ORAL
  Filled 2022-02-01: qty 2
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
## 2022-02-01 NOTE — Discharge Instructions (Signed)
Like we discussed, please continue to wear your knee sleeves that you were given as needed for comfort.  You can take Tylenol as needed for management of your pain.  Please follow the instructions on the bottle.  Please return to the emergency department with any new or worsening symptoms. ?
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
## 2022-02-01 NOTE — ED Notes (Signed)
Pt able to ambulate from wheelchair to bed.  

## 2022-02-02 ENCOUNTER — Emergency Department (HOSPITAL_COMMUNITY)
Admission: EM | Admit: 2022-02-02 | Discharge: 2022-02-02 | Disposition: A | Discharge status: Home / Self Care | Payer: Self-pay

## 2022-02-02 NOTE — ED Notes (Signed)
Pt is sleep in the lobby.  ?

## 2022-02-06 ENCOUNTER — Encounter (HOSPITAL_COMMUNITY): Payer: Self-pay | Admitting: Emergency Medicine

## 2022-02-06 ENCOUNTER — Emergency Department (HOSPITAL_COMMUNITY): Payer: Self-pay

## 2022-02-06 ENCOUNTER — Other Ambulatory Visit: Payer: Self-pay

## 2022-02-06 ENCOUNTER — Emergency Department (HOSPITAL_COMMUNITY)
Admission: EM | Admit: 2022-02-06 | Discharge: 2022-02-06 | Disposition: A | Discharge status: Home / Self Care | Payer: Self-pay | Attending: Emergency Medicine | Admitting: Emergency Medicine

## 2022-02-06 DIAGNOSIS — M542 Cervicalgia: Secondary | ICD-10-CM | POA: Insufficient documentation

## 2022-02-06 DIAGNOSIS — R220 Localized swelling, mass and lump, head: Secondary | ICD-10-CM | POA: Insufficient documentation

## 2022-02-06 DIAGNOSIS — H1131 Conjunctival hemorrhage, right eye: Secondary | ICD-10-CM | POA: Insufficient documentation

## 2022-02-06 DIAGNOSIS — M25562 Pain in left knee: Secondary | ICD-10-CM | POA: Insufficient documentation

## 2022-02-06 DIAGNOSIS — Y9259 Other trade areas as the place of occurrence of the external cause: Secondary | ICD-10-CM | POA: Insufficient documentation

## 2022-02-06 DIAGNOSIS — M25561 Pain in right knee: Secondary | ICD-10-CM | POA: Insufficient documentation

## 2022-02-06 DIAGNOSIS — S0990XA Unspecified injury of head, initial encounter: Secondary | ICD-10-CM | POA: Insufficient documentation

## 2022-02-06 MED ORDER — HYDROCODONE-ACETAMINOPHEN 5-325 MG PO TABS
1.0000 | ORAL_TABLET | Freq: Once | ORAL | Status: AC
  Administered 2022-02-06: 1 via ORAL
  Filled 2022-02-06: qty 1
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
## 2022-02-06 NOTE — Discharge Instructions (Signed)
Return to the ED with any new or worsening symptoms such as inability to see out of the right eye, painful eye movement, loss of consciousness, confusion ?Please take 600 mg of ibuprofen every 6 hours for swelling and pain ?Please follow-up with your PCP for ongoing management.  I have referred you to the [facility] community health and wellness clinic.  They can assist with finances and they also are free pharmacy. ?Please read the attached informational guide concerning head injuries and what to expect. ?

## 2022-02-06 NOTE — ED Notes (Signed)
Pt transported to CT ?

## 2022-02-06 NOTE — ED Notes (Signed)
Pt transported to Xray. 
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
## 2022-02-07 ENCOUNTER — Other Ambulatory Visit: Payer: Self-pay

## 2022-02-07 ENCOUNTER — Emergency Department (HOSPITAL_COMMUNITY)
Admission: EM | Admit: 2022-02-07 | Discharge: 2022-02-08 | Disposition: A | Discharge status: Home / Self Care | Payer: Self-pay | Attending: Emergency Medicine | Admitting: Emergency Medicine

## 2022-02-07 ENCOUNTER — Encounter (HOSPITAL_COMMUNITY): Payer: Self-pay | Admitting: Emergency Medicine

## 2022-02-07 DIAGNOSIS — G8929 Other chronic pain: Secondary | ICD-10-CM | POA: Insufficient documentation

## 2022-02-07 DIAGNOSIS — M25561 Pain in right knee: Secondary | ICD-10-CM | POA: Insufficient documentation

## 2022-02-07 DIAGNOSIS — M25562 Pain in left knee: Secondary | ICD-10-CM | POA: Insufficient documentation

## 2022-02-07 NOTE — ED Provider Notes (Signed)
?[facility] EMERGENCY DEPARTMENT ?Provider Note ? ? ?CSN: [id number] ?Arrival date & time: 02/07/22  2238 ? ?  ? ?History ? ?Chief Complaint  ?Patient presents with  ? Knee Pain  ? ? ?Zachary Kelley is a 41 y.o. male who is who presents with chronic bilateral knee pain.  Seen in ED yesterday for same.  Patient states that his knee continues to hurt and he cannot afford the medication that was recommended to him in the outpatient setting.  Patient is poorly participative in our discussion.  Demanding he be given more food to eat and then to turn the lights off so that he can sleep.  He states he is post to be getting his own apartment soon but does not know when and adamantly refuses any offers for information regarding shelters in the area. ? ?I personally reviewed his medical records.  Has history of tobacco abuse. ? ?HPI ? ?  ? ?Home Medications ?Prior to Admission medications   ?Medication Sig Start Date End Date Taking? Authorizing Provider  ?cyclobenzaprine (FLEXERIL) 10 MG tablet Take 1 tablet (10 mg total) by mouth 2 (two) times daily as needed for muscle spasms. 10/28/21   Fayrene Helper, PA-C  ?doxycycline (VIBRAMYCIN) 100 MG capsule Take 1 capsule (100 mg total) by mouth 2 (two) times daily. 11/10/21   Pollyann Savoy, MD  ?HYDROcodone-acetaminophen (NORCO) 5-325 MG tablet Take 1 tablet by mouth every 4 (four) hours as needed. 10/21/21   Pricilla Loveless, MD  ?ibuprofen (ADVIL) 600 MG tablet Take 1 tablet (600 mg total) by mouth every 6 (six) hours as needed for moderate pain. 10/28/21   Fayrene Helper, PA-C  ?   ? ?Allergies    ?Patient has no known allergies.   ? ?Review of Systems   ?Review of Systems  ?Musculoskeletal:  Positive for arthralgias.  ?All other systems reviewed and are negative. ? ?Physical Exam ?Updated Vital Signs ?BP 133/88 (BP Location: Right Arm)   Pulse 95   Temp 98 ?F (36.7 ?C) (Oral)   Resp 18   Ht 5\' 10"  (1.778 m)   Wt 81 kg   SpO2 98%   BMI 25.62 kg/m?   ?Physical Exam ?Vitals and nursing note reviewed.  ?Constitutional:   ?   Appearance: He is not ill-appearing or toxic-appearing.  ?HENT:  ?   Head: Normocephalic and atraumatic.  ?Eyes:  ?   General: No scleral icterus.    ?   Right eye: No discharge.     ?   Left eye: No discharge.  ?   Conjunctiva/sclera: Conjunctivae normal.  ?Pulmonary:  ?   Effort: Pulmonary effort is normal.  ?Musculoskeletal:  ?   Right hip: Normal.  ?   Left hip: Normal.  ?   Right upper leg: Normal.  ?   Left upper leg: Normal.  ?   Right knee: Normal.  ?   Left knee: No swelling, deformity, bony tenderness or crepitus. Tenderness present.  ?   Right lower leg: Normal.  ?   Left lower leg: Normal.  ?   Right ankle: Normal.  ?   Right Achilles Tendon: Normal.  ?   Left ankle: Normal.  ?   Left Achilles Tendon: Normal.  ?   Right foot: Normal.  ?   Left foot: Normal.  ?     Legs: ? ?Skin: ?   General: Skin is warm and dry.  ?   Capillary Refill: Capillary refill takes less than 2  seconds.  ?Neurological:  ?   General: No focal deficit present.  ?   Mental Status: He is alert.  ?   Gait: Gait is intact.  ?   Comments:  ? ?Patient states he cannot walk on the knee though he is ambulatory without difficulty in the emergency department, without antalgic changes ?  ?Psychiatric:     ?   Mood and Affect: Mood normal.  ? ? ?ED Results / Procedures / Treatments   ?Labs ?(all labs ordered are listed, but only abnormal results are displayed) ?Labs Reviewed - No data to display ? ?EKG ?None ? ?Radiology ? ? ?Procedures ?Procedures  ? ? ?Medications Ordered in ED ?Medications  ?HYDROcodone-acetaminophen (NORCO/VICODIN) 5-325 MG per tablet 1 tablet (has no administration in time range)  ? ? ?ED Course/ Medical Decision Making/ A&P ?  ?                        ?Medical Decision Making ?41 year old male with history of homelessness who presents with concern for chronic bilateral knee pain worse on the left than the right today. ? ?Vital signs are normal  and intake.  Cardiopulmonary and abdominal exams are benign.  Patient is neurovascular intact in all 4 extremities.  Mild soft tissue swelling over the anterior knee without erythema, induration, skin changes, or other signs of infectious etiology.  The remainder of the motion of the joint and ambulatory in the emergency department without antalgic changes. ? ?Risk ?Prescription drug management. ? ? ?Patient had imaging performed yesterday without acute osseous abnormality.  No indication for imaging today, no evidence of trauma on exam.  Knee brace and crutches offered as well as oral analgesia while in the emergency department.  Patient adamantly refusing crutches and any offer for information of shelters in the area. ? ?Ambulatory in the emergency department and well-appearing.  No further work-up warranted in the ER at this time.  May follow-up with the community health and wellness clinic as needed.  May return to the ER at any time. ? ?Zachary Kelley voiced understanding of her medical evaluation and treatment plan. Each of their questions answered to their expressed satisfaction.  Return precautions were given.  Patient is well-appearing, stable, and was discharged in good condition. ? ?This chart was dictated using voice recognition software, Dragon. Despite the best efforts of this provider to proofread and correct errors, errors may still occur which can change documentation meaning. ? ? ?Final Clinical Impression(s) / ED Diagnoses ?Final diagnoses:  ?Chronic pain of left knee  ? ? ?Rx / DC Orders ?ED Discharge Orders   ? ? None  ? ?  ? ? ?  ?Paris Lore, PA-C ?02/08/22 0013 ? ?  ?Dione Booze, MD ?02/08/22 [phone] ? ?

## 2022-02-07 NOTE — ED Triage Notes (Signed)
Pt c/o bilateral knee pain, was seen here yesterday with same complaint ?

## 2022-02-08 MED ORDER — HYDROCODONE-ACETAMINOPHEN 5-325 MG PO TABS
1.0000 | ORAL_TABLET | Freq: Once | ORAL | Status: AC
Start: 2022-02-08 — End: 2022-02-08
  Administered 2022-02-08: 1 via ORAL
  Filled 2022-02-08: qty 1

## 2022-02-08 NOTE — Discharge Instructions (Signed)
You were seen in the ER today for your knee pain. You may use the provided brace as needed. Follow up with the clinic below as needed.  ?

## 2022-02-08 NOTE — ED Notes (Signed)
Ortho at bedside.

## 2022-02-08 NOTE — ED Notes (Signed)
Pt A&OX4 at d/c, wheeled out to waiting room. Pt verbalized understanding of d/c instructions and follow up care. ?

## 2022-02-08 NOTE — Progress Notes (Signed)
Orthopedic Tech Progress Note ?Patient Details:  ?Nyan Devino ?11/05/1980 ?[id number] ? ?Ortho Devices ?Type of Ortho Device: Knee Sleeve ?Ortho Device/Splint Location: lle ?Ortho Device/Splint Interventions: Ordered, Application, Adjustment ?  ?Post Interventions ?Patient Tolerated: Well ?Instructions Provided: Care of device, Adjustment of device ? ?Trinna Post ?02/08/2022, 5:09 AM ? ?

## 2022-02-09 ENCOUNTER — Other Ambulatory Visit: Payer: Self-pay

## 2022-02-09 ENCOUNTER — Encounter (HOSPITAL_COMMUNITY): Payer: Self-pay

## 2022-02-09 ENCOUNTER — Emergency Department (HOSPITAL_COMMUNITY)
Admission: EM | Admit: 2022-02-09 | Discharge: 2022-02-10 | Disposition: A | Discharge status: Home / Self Care | Payer: Self-pay | Attending: Emergency Medicine | Admitting: Emergency Medicine

## 2022-02-09 DIAGNOSIS — M25561 Pain in right knee: Secondary | ICD-10-CM | POA: Insufficient documentation

## 2022-02-09 DIAGNOSIS — M25562 Pain in left knee: Secondary | ICD-10-CM | POA: Insufficient documentation

## 2022-02-09 DIAGNOSIS — G8929 Other chronic pain: Secondary | ICD-10-CM | POA: Insufficient documentation
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
## 2022-02-10 ENCOUNTER — Emergency Department (HOSPITAL_COMMUNITY): Payer: Self-pay

## 2022-02-10 NOTE — Discharge Instructions (Signed)
Please follow-up with a primary care provider I have given you the information for the [facility] wellness [facility].  Please call to make an appointment. ?

## 2022-02-10 NOTE — ED Notes (Signed)
Pt ambulated with assistance from this RN and Sheria Lang, RN ?
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
## 2022-02-11 ENCOUNTER — Emergency Department (HOSPITAL_COMMUNITY)
Admission: EM | Admit: 2022-02-11 | Discharge: 2022-02-11 | Disposition: A | Discharge status: Home / Self Care | Payer: Self-pay | Attending: Emergency Medicine | Admitting: Emergency Medicine

## 2022-02-11 ENCOUNTER — Other Ambulatory Visit: Payer: Self-pay

## 2022-02-11 ENCOUNTER — Encounter (HOSPITAL_COMMUNITY): Payer: Self-pay

## 2022-02-11 DIAGNOSIS — M25562 Pain in left knee: Secondary | ICD-10-CM | POA: Insufficient documentation

## 2022-02-11 DIAGNOSIS — M25561 Pain in right knee: Secondary | ICD-10-CM | POA: Insufficient documentation

## 2022-02-11 DIAGNOSIS — G8929 Other chronic pain: Secondary | ICD-10-CM | POA: Insufficient documentation
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
## 2022-02-12 ENCOUNTER — Emergency Department (HOSPITAL_COMMUNITY)
Admission: EM | Admit: 2022-02-12 | Discharge: 2022-02-12 | Discharge status: Against Medical Advice | Payer: Self-pay | Attending: Emergency Medicine | Admitting: Emergency Medicine

## 2022-02-12 ENCOUNTER — Emergency Department (HOSPITAL_COMMUNITY)
Admission: EM | Admit: 2022-02-12 | Discharge: 2022-02-12 | Discharge status: Against Medical Advice | Payer: Self-pay | Source: Home / Self Care

## 2022-02-12 DIAGNOSIS — Z5321 Procedure and treatment not carried out due to patient leaving prior to being seen by health care provider: Secondary | ICD-10-CM | POA: Insufficient documentation

## 2022-02-12 DIAGNOSIS — Z562 Threat of job loss: Secondary | ICD-10-CM | POA: Insufficient documentation
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
## 2022-02-13 ENCOUNTER — Other Ambulatory Visit: Payer: Self-pay

## 2022-02-13 ENCOUNTER — Encounter (HOSPITAL_COMMUNITY): Payer: Self-pay

## 2022-02-13 ENCOUNTER — Emergency Department (HOSPITAL_COMMUNITY)
Admission: EM | Admit: 2022-02-13 | Discharge: 2022-02-13 | Disposition: A | Discharge status: Home / Self Care | Payer: Self-pay | Attending: Emergency Medicine | Admitting: Emergency Medicine

## 2022-02-13 DIAGNOSIS — M25561 Pain in right knee: Secondary | ICD-10-CM | POA: Insufficient documentation

## 2022-02-13 DIAGNOSIS — Z5321 Procedure and treatment not carried out due to patient leaving prior to being seen by health care provider: Secondary | ICD-10-CM | POA: Insufficient documentation

## 2022-02-13 DIAGNOSIS — M25562 Pain in left knee: Secondary | ICD-10-CM | POA: Insufficient documentation

## 2022-02-13 NOTE — ED Notes (Signed)
Pt left. 
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
## 2022-02-17 ENCOUNTER — Other Ambulatory Visit: Payer: Self-pay

## 2022-02-17 ENCOUNTER — Encounter (HOSPITAL_COMMUNITY): Payer: Self-pay

## 2022-02-17 ENCOUNTER — Emergency Department (HOSPITAL_COMMUNITY)
Admission: EM | Admit: 2022-02-17 | Discharge: 2022-02-17 | Disposition: A | Discharge status: Home / Self Care | Payer: Self-pay | Attending: Emergency Medicine | Admitting: Emergency Medicine

## 2022-02-17 DIAGNOSIS — M25562 Pain in left knee: Secondary | ICD-10-CM | POA: Insufficient documentation

## 2022-02-17 DIAGNOSIS — G8929 Other chronic pain: Secondary | ICD-10-CM | POA: Insufficient documentation

## 2022-02-17 MED ORDER — IBUPROFEN 600 MG PO TABS: 600.0000 mg | ORAL_TABLET | Freq: Four times a day (QID) | ORAL | 0 refills | Status: DC | PRN

## 2022-02-17 NOTE — ED Notes (Signed)
Pt refusing vitals.
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
## 2022-02-17 NOTE — ED Notes (Signed)
Attempted to take patient back to a room, patient states he wants to stay on the couch and sleep. Will reattempt later per request.  ?

## 2022-02-17 NOTE — ED Notes (Signed)
Security at bedside for discharge

## 2022-02-17 NOTE — Discharge Instructions (Addendum)
I have evaluate your knee.  Fortunately I do not see any evidence of any skin infection such as cellulitis or septic joint.  Your knee is without any signs of trauma.  For your chronic knee pain you may take ibuprofen as needed and you may follow-up with orthopedist for further care. ?

## 2022-02-18 ENCOUNTER — Encounter (HOSPITAL_COMMUNITY): Payer: Self-pay | Admitting: Emergency Medicine

## 2022-02-18 ENCOUNTER — Emergency Department (HOSPITAL_COMMUNITY)
Admission: EM | Admit: 2022-02-18 | Discharge: 2022-02-18 | Disposition: A | Discharge status: Home / Self Care | Payer: Self-pay | Attending: Emergency Medicine | Admitting: Emergency Medicine

## 2022-02-18 DIAGNOSIS — M25562 Pain in left knee: Secondary | ICD-10-CM | POA: Insufficient documentation

## 2022-02-18 DIAGNOSIS — Y92410 Unspecified street and highway as the place of occurrence of the external cause: Secondary | ICD-10-CM | POA: Insufficient documentation

## 2022-02-18 DIAGNOSIS — M25569 Pain in unspecified knee: Secondary | ICD-10-CM

## 2022-02-18 MED ORDER — ACETAMINOPHEN 500 MG PO TABS
1000.0000 mg | ORAL_TABLET | Freq: Once | ORAL | Status: AC
  Administered 2022-02-18: 1000 mg via ORAL
  Filled 2022-02-18: qty 2

## 2022-02-18 NOTE — ED Notes (Signed)
Pt escorted out by security
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
## 2022-02-18 NOTE — ED Notes (Addendum)
Pt refused discharge vitals, pt angry about being woke up to be discharged.   ?

## 2022-02-18 NOTE — ED Notes (Signed)
Took patient two sandwiches and beverage per request - medication administered as MD ordered -  ?

## 2022-03-02 ENCOUNTER — Encounter (HOSPITAL_BASED_OUTPATIENT_CLINIC_OR_DEPARTMENT_OTHER): Payer: Self-pay | Admitting: Emergency Medicine

## 2022-03-02 ENCOUNTER — Other Ambulatory Visit: Payer: Self-pay

## 2022-03-02 ENCOUNTER — Emergency Department (HOSPITAL_COMMUNITY): Admission: EM | Admit: 2022-03-02 | Discharge: 2022-03-02 | Discharge status: Against Medical Advice | Payer: Self-pay

## 2022-03-02 DIAGNOSIS — M25562 Pain in left knee: Secondary | ICD-10-CM | POA: Insufficient documentation

## 2022-03-02 DIAGNOSIS — Y92481 Parking lot as the place of occurrence of the external cause: Secondary | ICD-10-CM | POA: Insufficient documentation
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
## 2022-03-03 ENCOUNTER — Emergency Department (HOSPITAL_BASED_OUTPATIENT_CLINIC_OR_DEPARTMENT_OTHER): Payer: Self-pay

## 2022-03-03 ENCOUNTER — Emergency Department (HOSPITAL_BASED_OUTPATIENT_CLINIC_OR_DEPARTMENT_OTHER)
Admission: EM | Admit: 2022-03-03 | Discharge: 2022-03-03 | Disposition: A | Discharge status: Home / Self Care | Payer: Self-pay | Attending: Emergency Medicine | Admitting: Emergency Medicine

## 2022-03-03 DIAGNOSIS — S8002XA Contusion of left knee, initial encounter: Secondary | ICD-10-CM
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
## 2022-03-03 NOTE — Discharge Instructions (Signed)
Take ibuprofen 600 mg every 6 hours as needed for pain. ? ?Follow-up with primary doctor if symptoms or not improving in the next week. ?

## 2022-03-04 ENCOUNTER — Encounter (HOSPITAL_BASED_OUTPATIENT_CLINIC_OR_DEPARTMENT_OTHER): Payer: Self-pay

## 2022-03-04 ENCOUNTER — Emergency Department (HOSPITAL_BASED_OUTPATIENT_CLINIC_OR_DEPARTMENT_OTHER)
Admission: EM | Admit: 2022-03-04 | Discharge: 2022-03-04 | Disposition: A | Discharge status: Home / Self Care | Payer: Self-pay | Attending: Emergency Medicine | Admitting: Emergency Medicine

## 2022-03-04 ENCOUNTER — Encounter (HOSPITAL_COMMUNITY): Payer: Self-pay

## 2022-03-04 ENCOUNTER — Other Ambulatory Visit: Payer: Self-pay

## 2022-03-04 ENCOUNTER — Emergency Department (HOSPITAL_COMMUNITY)
Admission: EM | Admit: 2022-03-04 | Discharge: 2022-03-04 | Disposition: A | Discharge status: Home / Self Care | Payer: Self-pay | Attending: Emergency Medicine | Admitting: Emergency Medicine

## 2022-03-04 DIAGNOSIS — G8929 Other chronic pain: Secondary | ICD-10-CM | POA: Insufficient documentation

## 2022-03-04 DIAGNOSIS — M25561 Pain in right knee: Secondary | ICD-10-CM | POA: Insufficient documentation

## 2022-03-04 DIAGNOSIS — R030 Elevated blood-pressure reading, without diagnosis of hypertension: Secondary | ICD-10-CM | POA: Insufficient documentation

## 2022-03-04 DIAGNOSIS — M25562 Pain in left knee: Secondary | ICD-10-CM | POA: Insufficient documentation

## 2022-03-04 DIAGNOSIS — Z765 Malingerer [conscious simulation]: Secondary | ICD-10-CM

## 2022-03-04 MED ORDER — IBUPROFEN 800 MG PO TABS
800.0000 mg | ORAL_TABLET | Freq: Once | ORAL | Status: AC
  Administered 2022-03-04: 800 mg via ORAL
  Filled 2022-03-04: qty 1

## 2022-03-04 NOTE — ED Notes (Signed)
Pt ambulating without difficulty to nurses station multiple times to ask for food. ?

## 2022-03-04 NOTE — ED Provider Notes (Signed)
?  [facility]-EMERGENCY DEPT ?Provider Note ? ? ?CSN: [id number] ?Arrival date & time: 03/04/22  0218 ? ?  ? ?History ? ?Chief Complaint  ?Patient presents with  ? Knee Pain  ? ? ?Zachary Kelley is a 41 y.o. male. ? ?The history is provided by the patient.  ?Knee Pain ?He is complaining of right knee pain.  He had been seen at [facility] earlier tonight with the same complaint and he states that there is nothing new.  He has had x-rays done already.  He denies any recent trauma. ?  ?Home Medications ?Prior to Admission medications   ?Medication Sig Start Date End Date Taking? Authorizing Provider  ?ibuprofen (ADVIL) 600 MG tablet Take 1 tablet (600 mg total) by mouth every 6 (six) hours as needed for moderate pain. 02/17/22   Fayrene Helper, PA-C  ?   ? ?Allergies    ?Patient has no known allergies.   ? ?Review of Systems   ?Review of Systems  ?All other systems reviewed and are negative. ? ?Physical Exam ?Updated Vital Signs ?BP (!) 146/86   Pulse 99   Temp 97.9 ?F (36.6 ?C) (Oral)   Resp 18   SpO2 97%  ?Physical Exam ?Vitals and nursing note reviewed.  ?41 year old male, resting comfortably and in no acute distress. Vital signs are significant for mildly elevated blood pressure. Oxygen saturation is 97%, which is normal. ?Head is normocephalic and atraumatic. PERRLA, EOMI. Oropharynx is clear. ?Neck is nontender and supple without adenopathy or JVD. ?Back is nontender and there is no CVA tenderness. ?Lungs are clear without rales, wheezes, or rhonchi. ?Chest is nontender. ?Heart has regular rate and rhythm without murmur. ?Abdomen is soft, flat, nontender without masses or hepatosplenomegaly and peristalsis is normoactive. ?Extremities have no cyanosis or edema, full range of motion is present.  Some crepitus is noted with range of motion of the right knee. ?Skin is warm and dry without rash. ?Neurologic: Mental status is normal, cranial nerves are intact, moves all extremities  equally. ? ?ED Results / Procedures / Treatments   ? ?Procedures ?Procedures  ? ? ?Medications Ordered in ED ?Medications - No data to display ? ?ED Course/ Medical Decision Making/ A&P ?  ?                        ?Medical Decision Making ? ?Right knee pain which appears to be chronic and unchanged.  Old records are reviewed, and he has multiple ED visits for knee pain, including earlier tonight.  He has had 35 emergency department visits in the last 6 months.  X-ray obtained earlier tonight showed chronic changes and no acute change.  Patient was sleeping in the room when I went in to evaluate him and it was very difficult to arouse.  It seems that he mainly was looking for a warm place to sleep.  He is discharged with instructions to follow-up with orthopedics.  He does have significant degenerative changes in his knee, and may actually need a knee arthroplasty. ? ?Final Clinical Impression(s) / ED Diagnoses ?Final diagnoses:  ?Chronic pain of right knee  ? ? ?Rx / DC Orders ?ED Discharge Orders   ? ? None  ? ?  ? ? ?  ?Dione Booze, MD ?03/04/22 [phone] ? ?

## 2022-03-04 NOTE — Discharge Instructions (Addendum)
Apply ice to any painful joint.  Ice can be applied for 30 minutes at a time, 4 times a day. ? ?You may take ibuprofen and/or acetaminophen as needed for pain. ? ?You need to see the orthopedic doctor for evaluation of your chronic knee pain. ?

## 2022-03-04 NOTE — ED Provider Notes (Signed)
?[facility] EMERGENCY DEPT ?Provider Note ? ? ?CSN: [id number] ?Arrival date & time: 03/04/22  0104 ? ?  ? ?History ? ?Chief Complaint  ?Patient presents with  ? Knee Pain  ? ? ?Zachary Kelley is a 41 y.o. male. ? ?Patient presenting with knee pain.  He was seen last night with the same complaint.  Patient with history of frequent ER visits involving the same complaint and seems to be malingering.  He denies any other new issues.  He tells me he has no money to buy any medication to take for pain. ? ?The history is provided by the patient.  ? ?  ? ?Home Medications ?Prior to Admission medications   ?Medication Sig Start Date End Date Taking? Authorizing Provider  ?cyclobenzaprine (FLEXERIL) 10 MG tablet Take 1 tablet (10 mg total) by mouth 2 (two) times daily as needed for muscle spasms. 10/28/21   Fayrene Helper, PA-C  ?doxycycline (VIBRAMYCIN) 100 MG capsule Take 1 capsule (100 mg total) by mouth 2 (two) times daily. 11/10/21   Pollyann Savoy, MD  ?HYDROcodone-acetaminophen (NORCO) 5-325 MG tablet Take 1 tablet by mouth every 4 (four) hours as needed. 10/21/21   Pricilla Loveless, MD  ?ibuprofen (ADVIL) 600 MG tablet Take 1 tablet (600 mg total) by mouth every 6 (six) hours as needed for moderate pain. 02/17/22   Fayrene Helper, PA-C  ?   ? ?Allergies    ?Patient has no known allergies.   ? ?Review of Systems   ?Review of Systems  ?All other systems reviewed and are negative. ? ?Physical Exam ?Updated Vital Signs ?BP (!) 143/75   Pulse 97   Temp 98.3 ?F (36.8 ?C) (Oral)   Resp 18   Ht 5\' 10"  (1.778 m)   Wt 79.8 kg   SpO2 98%   BMI 25.25 kg/m?  ?Physical Exam ?Vitals and nursing note reviewed.  ?Constitutional:   ?   Appearance: Normal appearance.  ?HENT:  ?   Head: Normocephalic.  ?Pulmonary:  ?   Effort: Pulmonary effort is normal.  ?Musculoskeletal:  ?   Comments: The left knee appears grossly normal.  There is no effusion.  Patient has been observed ambulating without a limp and in no distress.   ?Skin: ?   General: Skin is warm and dry.  ?Neurological:  ?   Mental Status: He is alert.  ? ? ?ED Results / Procedures / Treatments   ?Labs ?(all labs ordered are listed, but only abnormal results are displayed) ?Labs Reviewed - No data to display ? ?EKG ?None ? ?Radiology ?DG Knee 1-2 Views Left ? ?Result Date: 03/03/2022 ?CLINICAL DATA:  Chronic left knee pain EXAM: LEFT KNEE - 2 VIEW COMPARISON:  02/06/2022 FINDINGS: Degenerative changes are noted in all 3 joint compartments. Calcification along the superior aspect of the medial collateral ligament is noted and stable. No acute fracture or dislocation is seen. No soft tissue abnormality is noted. IMPRESSION: No acute abnormality noted. Chronic degenerative changes are again seen and stable. Electronically Signed   By: 04/08/2022 M.D.   On: 03/03/2022 03:56   ? ?Procedures ?Procedures  ? ? ?Medications Ordered in ED ?Medications  ?ibuprofen (ADVIL) tablet 800 mg (has no administration in time range)  ? ? ?ED Course/ Medical Decision Making/ A&P ? ?Patient presenting with complaints of knee pain.  He has been presenting to the ER frequently with this same complaint for many months.  X-rays last night were negative.  Patient is ambulatory here in the  ER without difficulty.  He is not limping and his knee appears identical to how it did last night.  I highly suspect he is malingering and can safely be discharged. ? ?Final Clinical Impression(s) / ED Diagnoses ?Final diagnoses:  ?None  ? ? ?Rx / DC Orders ?ED Discharge Orders   ? ? None  ? ?  ? ? ?  ?Geoffery Lyons, MD ?03/04/22 [phone] ? ?
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
## 2022-03-04 NOTE — ED Notes (Signed)
I provided reinforced discharge education based off of discharge instructions. Pt acknowledged and understood my education. Pt had no further questions/concerns for provider/myself.  °

## 2022-03-04 NOTE — Discharge Instructions (Signed)
Follow-up with a primary doctor to discuss your chronic knee pain. ?

## 2022-03-05 ENCOUNTER — Other Ambulatory Visit: Payer: Self-pay

## 2022-03-05 ENCOUNTER — Encounter (HOSPITAL_COMMUNITY): Payer: Self-pay

## 2022-03-05 ENCOUNTER — Emergency Department (HOSPITAL_COMMUNITY)
Admission: EM | Admit: 2022-03-05 | Discharge: 2022-03-05 | Disposition: A | Discharge status: Home / Self Care | Payer: Self-pay | Attending: Emergency Medicine | Admitting: Emergency Medicine

## 2022-03-05 DIAGNOSIS — M25562 Pain in left knee: Secondary | ICD-10-CM | POA: Insufficient documentation
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
## 2022-03-05 NOTE — Discharge Instructions (Signed)
Take Motrin and Tylenol as needed as directed for pain.  Wear Ace wrap on knee as needed for support.  Follow-up with orthopedics if not improving. ?

## 2022-04-30 ENCOUNTER — Emergency Department (HOSPITAL_COMMUNITY): Payer: Self-pay

## 2022-04-30 ENCOUNTER — Other Ambulatory Visit: Payer: Self-pay

## 2022-04-30 ENCOUNTER — Emergency Department (HOSPITAL_COMMUNITY)
Admission: EM | Admit: 2022-04-30 | Discharge: 2022-04-30 | Disposition: A | Discharge status: Home / Self Care | Payer: Self-pay | Attending: Emergency Medicine | Admitting: Emergency Medicine

## 2022-04-30 ENCOUNTER — Encounter (HOSPITAL_COMMUNITY): Payer: Self-pay | Admitting: Emergency Medicine

## 2022-04-30 DIAGNOSIS — Y906 Blood alcohol level of 120-199 mg/100 ml: Secondary | ICD-10-CM | POA: Insufficient documentation

## 2022-04-30 DIAGNOSIS — R519 Headache, unspecified: Secondary | ICD-10-CM | POA: Insufficient documentation

## 2022-04-30 DIAGNOSIS — Z716 Tobacco abuse counseling: Secondary | ICD-10-CM | POA: Insufficient documentation

## 2022-04-30 DIAGNOSIS — M542 Cervicalgia: Secondary | ICD-10-CM | POA: Insufficient documentation

## 2022-04-30 DIAGNOSIS — R4182 Altered mental status, unspecified: Secondary | ICD-10-CM | POA: Insufficient documentation

## 2022-04-30 DIAGNOSIS — F1010 Alcohol abuse, uncomplicated: Secondary | ICD-10-CM | POA: Insufficient documentation

## 2022-04-30 DIAGNOSIS — R110 Nausea: Secondary | ICD-10-CM | POA: Insufficient documentation

## 2022-04-30 LAB — CBC WITH DIFFERENTIAL/PLATELET
Abs Immature Granulocytes: 0.03 10*3/uL (ref 0.00–0.07)
Basophils Absolute: 0 10*3/uL (ref 0.0–0.1)
Basophils Relative: 0 %
Eosinophils Absolute: 0.1 10*3/uL (ref 0.0–0.5)
Eosinophils Relative: 1 %
HCT: 35.5 % — ABNORMAL LOW (ref 39.0–52.0)
Hemoglobin: 12.1 g/dL — ABNORMAL LOW (ref 13.0–17.0)
Immature Granulocytes: 0 %
Lymphocytes Relative: 38 %
Lymphs Abs: 3.3 10*3/uL (ref 0.7–4.0)
MCH: 30.6 pg (ref 26.0–34.0)
MCHC: 34.1 g/dL (ref 30.0–36.0)
MCV: 89.6 fL (ref 80.0–100.0)
Monocytes Absolute: 0.6 10*3/uL (ref 0.1–1.0)
Monocytes Relative: 7 %
Neutro Abs: 4.7 10*3/uL (ref 1.7–7.7)
Neutrophils Relative %: 54 %
Platelets: 193 10*3/uL (ref 150–400)
RBC: 3.96 MIL/uL — ABNORMAL LOW (ref 4.22–5.81)
RDW: 11.9 % (ref 11.5–15.5)
WBC: 8.8 10*3/uL (ref 4.0–10.5)
nRBC: 0 % (ref 0.0–0.2)

## 2022-04-30 LAB — COMPREHENSIVE METABOLIC PANEL
ALT: 12 U/L (ref 0–44)
AST: 21 U/L (ref 15–41)
Albumin: 3.8 g/dL (ref 3.5–5.0)
Alkaline Phosphatase: 38 U/L (ref 38–126)
Anion gap: 11 (ref 5–15)
BUN: 12 mg/dL (ref 6–20)
CO2: 20 mmol/L — ABNORMAL LOW (ref 22–32)
Calcium: 8.2 mg/dL — ABNORMAL LOW (ref 8.9–10.3)
Chloride: 110 mmol/L (ref 98–111)
Creatinine, Ser: 1.13 mg/dL (ref 0.61–1.24)
GFR, Estimated: 44 mL/min — ABNORMAL LOW (ref >60)
Glucose, Bld: 82 mg/dL (ref 70–99)
Potassium: 3.7 mmol/L (ref 3.5–5.1)
Sodium: 141 mmol/L (ref 135–145)
Total Bilirubin: 0.9 mg/dL (ref 0.3–1.2)
Total Protein: 5.7 g/dL — ABNORMAL LOW (ref 6.5–8.1)

## 2022-04-30 LAB — RAPID URINE DRUG SCREEN, HOSP PERFORMED
Amphetamines: NOT DETECTED
Barbiturates: NOT DETECTED
Benzodiazepines: NOT DETECTED
Cocaine: NOT DETECTED
Opiates: NOT DETECTED
Tetrahydrocannabinol: NOT DETECTED

## 2022-04-30 LAB — ETHANOL: Alcohol, Ethyl (B): 187 mg/dL — ABNORMAL HIGH (ref <10)

## 2022-04-30 MED ORDER — SODIUM CHLORIDE 0.9 % IV BOLUS
1000.0000 mL | Freq: Once | INTRAVENOUS | Status: AC
  Administered 2022-04-30: 1000 mL via INTRAVENOUS

## 2022-04-30 MED ORDER — ONE-A-DAY MENS PO TABS: 1.0000 | ORAL_TABLET | Freq: Every day | ORAL | 0 refills | Status: AC

## 2022-04-30 MED ORDER — ONDANSETRON HCL 4 MG/2ML IJ SOLN
4.0000 mg | Freq: Once | INTRAMUSCULAR | Status: DC
  Filled 2022-04-30: qty 2

## 2022-04-30 NOTE — ED Notes (Signed)
Patient alerts and oriented x4 upon discharge, ambulatory with steady gait.

## 2022-04-30 NOTE — Discharge Instructions (Addendum)
It was a pleasure caring for you today in the emergency department.  Please return to the emergency department for any worsening or worrisome symptoms.  Please consider alcohol and tobacco cessation.  
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
## 2022-04-30 NOTE — ED Provider Notes (Signed)
[facility] EMERGENCY DEPARTMENT Provider Note   CSN: [id number] Arrival date & time: 04/30/22  1629     History  Chief Complaint  Patient presents with   Altered Mental Status    Zachary Kelley is a 41 y.o. male.  Patient as above with significant medical history as below, including chronic etoh use who presents to the ED with complaint of etoh, fall.  Patient was found by EMS outside [employer] [place] laying on the ground.  He did have alcohol beverage containers beside him.  Patient does report headache, neck pain.  Some mild nausea.  Otherwise he has no acute complaints.  Does report frequent alcohol use; approximately 4-6 tall boy style beers daily.  Consuming alcohol just prior to arrival.  No illicit drug use.  Does not take daily medications.  No change in bowel or bladder function.  No chest pain or dyspnea.  No abdominal pain.  No numbness or tingling.  Patient appears intoxicated     History reviewed. No pertinent past medical history.  History reviewed. No pertinent surgical history.   The history is provided by the patient. No language interpreter was used.  Altered Mental Status Associated symptoms: headaches and nausea        Home Medications Prior to Admission medications   Medication Sig Start Date End Date Taking? Authorizing Provider  multivitamin (ONE-A-DAY MEN'S) TABS tablet Take 1 tablet by mouth daily. 04/30/22 05/30/22 Yes Sloan Leiter, DO      Allergies    Patient has no allergy information on record.    Review of Systems   Review of Systems  Unable to perform ROS: Mental status change  Gastrointestinal:  Positive for nausea.  Neurological:  Positive for headaches.    Physical Exam Updated Vital Signs BP (!) 134/94   Pulse 84   Temp 98.1 F (36.7 C) (Oral)   Resp 16   SpO2 99%  Physical Exam Vitals and nursing note reviewed.  Constitutional:      General: He is not in acute distress.    Appearance: He is  well-developed. He is not ill-appearing or toxic-appearing.     Comments: Malodorous, disheveled appearance  HENT:     Head: Normocephalic and atraumatic. No raccoon eyes, Battle's sign, right periorbital erythema or left periorbital erythema.     Jaw: There is normal jaw occlusion. No trismus.     Comments: C-collar in place  No TTP to midline spinous process on palpation percussion    Right Ear: External ear normal.     Left Ear: External ear normal.     Mouth/Throat:     Mouth: Mucous membranes are moist.  Eyes:     General: No scleral icterus. Cardiovascular:     Rate and Rhythm: Normal rate and regular rhythm.     Pulses: Normal pulses.     Heart sounds: Normal heart sounds.  Pulmonary:     Effort: Pulmonary effort is normal. No respiratory distress.     Breath sounds: Normal breath sounds.  Abdominal:     General: Abdomen is flat.     Palpations: Abdomen is soft.     Tenderness: There is no abdominal tenderness.  Musculoskeletal:        General: Normal range of motion.     Cervical back: Normal range of motion.     Right lower leg: No edema.     Left lower leg: No edema.  Skin:    General: Skin is warm and dry.  Capillary Refill: Capillary refill takes less than 2 seconds.  Neurological:     Mental Status: He is alert. He is disoriented.     GCS: GCS eye subscore is 4. GCS verbal subscore is 4. GCS motor subscore is 6.     Cranial Nerves: Cranial nerves 2-12 are intact. No dysarthria or facial asymmetry.     Sensory: Sensation is intact.     Motor: Motor function is intact. No tremor.     Coordination: Coordination is intact.     Comments: Appears intoxicated  Psychiatric:        Mood and Affect: Mood normal.        Behavior: Behavior normal.     ED Results / Procedures / Treatments   Labs (all labs ordered are listed, but only abnormal results are displayed) Labs Reviewed  CBC WITH DIFFERENTIAL/PLATELET - Abnormal; Notable for the following components:       Result Value   RBC 3.96 (*)    Hemoglobin 12.1 (*)    HCT 35.5 (*)    All other components within normal limits  COMPREHENSIVE METABOLIC PANEL - Abnormal; Notable for the following components:   CO2 20 (*)    Calcium 8.2 (*)    Total Protein 5.7 (*)    GFR, Estimated 44 (*)    All other components within normal limits  ETHANOL - Abnormal; Notable for the following components:   Alcohol, Ethyl (B) 187 (*)    All other components within normal limits  RAPID URINE DRUG SCREEN, HOSP PERFORMED    EKG EKG Interpretation  Date/Time:  Friday April 30 2022 16:46:38 EDT Ventricular Rate:  84 PR Interval:  158 QRS Duration: 102 QT Interval:  418 QTC Calculation: 495 R Axis:   45 Text Interpretation: Sinus rhythm Minimal ST elevation, anterior leads Borderline prolonged QT interval No old tracing to compare no stemi Confirmed by Tanda RockersGray, Chanda Laperle ([id number]) on 04/30/2022 5:27:42 PM  Radiology DG Chest Portable 1 View  Result Date: 04/30/2022 CLINICAL DATA:  ETOH EXAM: PORTABLE CHEST 1 VIEW COMPARISON:  CT 10/22/2021 FINDINGS: The heart size and mediastinal contours are within normal limits. Both lungs are clear. The visualized skeletal structures are unremarkable. IMPRESSION: No active disease. Electronically Signed   By: Jasmine PangKim  Fujinaga M.D.   On: 04/30/2022 18:27   CT Head Wo Contrast  Result Date: 04/30/2022 CLINICAL DATA:  Delirium.  Alcohol intoxication.  Neck trauma. EXAM: CT HEAD WITHOUT CONTRAST CT CERVICAL SPINE WITHOUT CONTRAST TECHNIQUE: Multidetector CT imaging of the head and cervical spine was performed following the standard protocol without intravenous contrast. Multiplanar CT image reconstructions of the cervical spine were also generated. RADIATION DOSE REDUCTION: This exam was performed according to the departmental dose-optimization program which includes automated exposure control, adjustment of the mA and/or kV according to patient size and/or use of iterative reconstruction  technique. COMPARISON:  CT head and cervical spine 02/06/2022 FINDINGS: CT HEAD FINDINGS Brain: There is no evidence of acute intracranial hemorrhage, mass lesion, brain edema or extra-axial fluid collection. The ventricles and subarachnoid spaces are appropriately sized for age. There is no CT evidence of acute cortical infarction. Vascular:  No hyperdense vessel identified. Skull: Negative for fracture or focal lesion. Sinuses/Orbits: The visualized paranasal sinuses and mastoid air cells are clear. No orbital abnormalities are seen. Other: Possible mild soft tissue swelling at the parietal vertex. CT CERVICAL SPINE FINDINGS Alignment: Straightening without focal angulation or listhesis. Skull base and vertebrae: No evidence of acute cervical spine fracture or  traumatic subluxation. Soft tissues and spinal canal: No prevertebral fluid or swelling. No visible canal hematoma. Disc levels: No large disc herniation or significant foraminal narrowing. Upper chest: Clear lung apices. Other: None. IMPRESSION: 1. No acute intracranial or calvarial findings. Possible mild soft tissue swelling at the parietal vertex. 2. No evidence of acute cervical spine fracture, traumatic subluxation or static signs of instability. Electronically Signed   By: Carey Bullocks M.D.   On: 04/30/2022 17:54   CT Cervical Spine Wo Contrast  Result Date: 04/30/2022 CLINICAL DATA:  Delirium.  Alcohol intoxication.  Neck trauma. EXAM: CT HEAD WITHOUT CONTRAST CT CERVICAL SPINE WITHOUT CONTRAST TECHNIQUE: Multidetector CT imaging of the head and cervical spine was performed following the standard protocol without intravenous contrast. Multiplanar CT image reconstructions of the cervical spine were also generated. RADIATION DOSE REDUCTION: This exam was performed according to the departmental dose-optimization program which includes automated exposure control, adjustment of the mA and/or kV according to patient size and/or use of iterative  reconstruction technique. COMPARISON:  CT head and cervical spine 02/06/2022 FINDINGS: CT HEAD FINDINGS Brain: There is no evidence of acute intracranial hemorrhage, mass lesion, brain edema or extra-axial fluid collection. The ventricles and subarachnoid spaces are appropriately sized for age. There is no CT evidence of acute cortical infarction. Vascular:  No hyperdense vessel identified. Skull: Negative for fracture or focal lesion. Sinuses/Orbits: The visualized paranasal sinuses and mastoid air cells are clear. No orbital abnormalities are seen. Other: Possible mild soft tissue swelling at the parietal vertex. CT CERVICAL SPINE FINDINGS Alignment: Straightening without focal angulation or listhesis. Skull base and vertebrae: No evidence of acute cervical spine fracture or traumatic subluxation. Soft tissues and spinal canal: No prevertebral fluid or swelling. No visible canal hematoma. Disc levels: No large disc herniation or significant foraminal narrowing. Upper chest: Clear lung apices. Other: None. IMPRESSION: 1. No acute intracranial or calvarial findings. Possible mild soft tissue swelling at the parietal vertex. 2. No evidence of acute cervical spine fracture, traumatic subluxation or static signs of instability. Electronically Signed   By: Carey Bullocks M.D.   On: 04/30/2022 17:54    Procedures Procedures    Medications Ordered in ED Medications  ondansetron (ZOFRAN) injection 4 mg (4 mg Intravenous Patient Refused/Not Given 04/30/22 1713)  sodium chloride 0.9 % bolus 1,000 mL (0 mLs Intravenous Stopped 04/30/22 2008)    ED Course/ Medical Decision Making/ A&P Clinical Course as of 04/30/22 2122  Fri Apr 30, 2022  1750 Patient reassessed, reports he is feeling much better.  He has no nausea.  No headache.  He is asking for something to eat.  Denies SI or HI.  Denies hallucinations.  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  Denies illicit drug use.  Neurologically he is intact.  GCS 14 as he does remain intoxicated [SG]  1854 Imaging reviewed, no acute process.  [SG]    Clinical Course User Index [SG] Sloan Leiter, DO                           Medical Decision Making Amount and/or Complexity of Data Reviewed Labs: ordered. Radiology: ordered. ECG/medicine tests: ordered.  Risk Prescription drug management.    CC: AMS, intoxication  This patient presents to the Emergency Department for the above complaint. This involves an extensive number of treatment  options and is a complaint that carries with it a high risk of complications and morbidity. Vital signs were reviewed. Serious etiologies considered.  Differential diagnoses for altered mental status includes but is not exclusive to alcohol, illicit or prescription medications, intracranial pathology such as stroke, intracerebral hemorrhage, fever or infectious causes including sepsis, hypoxemia, uremia, trauma, endocrine related disorders such as diabetes, hypoglycemia, thyroid-related diseases, etc.   Record review:  Previous records obtained and reviewed patient is marked for merge, MRN [id number] Prior ED visits reviewed, prior labs and imaging,  Additional history obtained from EMS  Medical and surgical history as noted above.   Work up as above, notable for:  Labs & imaging results that were available during my care of the patient were visualized by me and considered in my medical decision making.   I ordered imaging studies which included CT head, CT cervical spine, chest x-ray. I visualized the imaging, interpreted images, and I agree with radiologist interpretation.  No acute abnormalities  Cardiac monitoring reviewed and interpreted personally which shows NSR  Labs reviewed, his ethanol is elevated.  UDS negative.  Labs otherwise stable.  Management: Zofran, fluids  ED Course: Clinical Course as of  04/30/22 2122  Fri Apr 30, 2022  1750 Patient reassessed, reports he is feeling much better.  He has no nausea.  No headache.  He is asking for something to eat.  Denies SI or HI.  Denies hallucinations.  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  Denies illicit drug use.  Neurologically he is intact.  GCS 14 as he does remain intoxicated [SG]  1854 Imaging reviewed, no acute process.  [SG]    Clinical Course User Index [SG] Sloan Leiter, DO     Reassessment:  Patient feeling better, he appears clinically sober.  He is ambulatory with a steady gait.  Time p.o. intake.  Repeat neurologic testing is unremarkable.  Patient is not endorsing SI or HI.  AMS likely secondary to intoxication.  He does not appear to be in acute alcohol withdrawal.  Appears stable for discharge at this time.  Admission was considered.   The patient improved significantly and was discharged in stable condition. Detailed discussions were had with the patient regarding current findings, and need for close f/u with PCP or on call doctor. The patient has been instructed to return immediately if the symptoms worsen in any way for re-evaluation. Patient verbalized understanding and is in agreement with current care plan. All questions answered prior to discharge.               Social determinants of health include -  Counseled patient for approximately 4 minutes regarding smoking cessation, alcohol cessation.  Discussed risks of smoking/wreaking alcohol in excess and how they applied and affected their visit here today. Patient not ready to quit at this time, however will follow up with their primary doctor when they are.   CPT code: 58527: intermediate counseling for smoking cessation   Social History   Socioeconomic History   Marital status: Single    Spouse name: Not on file   Number of children: Not on file    Years of education: Not on file   Highest education level: Not on file  Occupational History   Not on file  Tobacco Use   Smoking status: Not on file   Smokeless tobacco: Not on file  Substance and Sexual Activity   Alcohol use: Not on file   Drug use: Not on file   Sexual activity: Not on file  Other Topics Concern   Not on file  Social History Narrative   Not on file   Social Determinants of Health   Financial Resource Strain: Not on file  Food Insecurity: Not on file  Transportation Needs: Not on file  Physical Activity: Not on file  Stress: Not on file  Social Connections: Not on file  Intimate Partner Violence: Not on file      This chart was dictated using voice recognition software.  Despite best efforts to proofread,  errors can occur which can change the documentation meaning.         Final Clinical Impression(s) / ED Diagnoses Final diagnoses:  Alcohol abuse  Altered mental status, unspecified altered mental status type    Rx / DC Orders ED Discharge Orders          Ordered    multivitamin (ONE-A-DAY MEN'S) TABS tablet  Daily        04/30/22 2122              Sloan Leiter, DO 04/30/22 2122

## 2022-05-02 ENCOUNTER — Other Ambulatory Visit: Payer: Self-pay

## 2022-05-02 ENCOUNTER — Emergency Department (HOSPITAL_COMMUNITY)
Admission: EM | Admit: 2022-05-02 | Discharge: 2022-05-02 | Discharge status: Against Medical Advice | Payer: Self-pay | Attending: Emergency Medicine | Admitting: Emergency Medicine

## 2022-05-02 ENCOUNTER — Encounter (HOSPITAL_COMMUNITY): Payer: Self-pay | Admitting: *Deleted

## 2022-05-02 ENCOUNTER — Emergency Department (HOSPITAL_COMMUNITY)
Admission: EM | Admit: 2022-05-02 | Discharge: 2022-05-02 | Disposition: A | Discharge status: Home / Self Care | Payer: Self-pay | Attending: Student | Admitting: Student

## 2022-05-02 DIAGNOSIS — M25562 Pain in left knee: Secondary | ICD-10-CM | POA: Insufficient documentation

## 2022-05-02 DIAGNOSIS — Z5900 Homelessness unspecified: Secondary | ICD-10-CM | POA: Insufficient documentation

## 2022-05-02 DIAGNOSIS — G8929 Other chronic pain: Secondary | ICD-10-CM | POA: Insufficient documentation

## 2022-05-02 DIAGNOSIS — Z5321 Procedure and treatment not carried out due to patient leaving prior to being seen by health care provider: Secondary | ICD-10-CM | POA: Insufficient documentation

## 2022-05-02 MED ORDER — IBUPROFEN 400 MG PO TABS: 400.0000 mg | ORAL_TABLET | Freq: Once | ORAL | Status: DC

## 2022-05-02 NOTE — ED Triage Notes (Signed)
a 

## 2022-05-02 NOTE — Discharge Instructions (Signed)
Knee pain-May use over-the-counter pain medication as needed apply ice to the area.  Follow-up with community health and wellness for further evaluation  I have given you resources for shelters within the area.  Come back to the emergency department if you develop chest pain, shortness of breath, severe abdominal pain, uncontrolled nausea, vomiting, diarrhea.
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
## 2022-05-02 NOTE — ED Notes (Signed)
Pt called multiple times no answer 

## 2022-05-03 ENCOUNTER — Encounter (HOSPITAL_COMMUNITY): Payer: Self-pay | Admitting: *Deleted

## 2022-05-10 ENCOUNTER — Other Ambulatory Visit: Payer: Self-pay

## 2022-05-10 ENCOUNTER — Encounter (HOSPITAL_COMMUNITY): Payer: Self-pay | Admitting: Emergency Medicine

## 2022-05-10 ENCOUNTER — Emergency Department (HOSPITAL_COMMUNITY)
Admission: EM | Admit: 2022-05-10 | Discharge: 2022-05-10 | Disposition: A | Discharge status: Home / Self Care | Payer: Self-pay | Attending: Emergency Medicine | Admitting: Emergency Medicine

## 2022-05-10 DIAGNOSIS — G8929 Other chronic pain: Secondary | ICD-10-CM | POA: Insufficient documentation

## 2022-05-10 DIAGNOSIS — M25562 Pain in left knee: Secondary | ICD-10-CM | POA: Insufficient documentation

## 2022-05-10 MED ORDER — IBUPROFEN 800 MG PO TABS
800.0000 mg | ORAL_TABLET | Freq: Once | ORAL | Status: AC
  Administered 2022-05-10: 800 mg via ORAL
  Filled 2022-05-10: qty 1

## 2022-05-10 NOTE — ED Provider Notes (Signed)
[facility] Provider Note   CSN: [id number] Arrival date & time: 05/10/22  9242     History  Chief Complaint  Patient presents with   Knee Pain    [facility] is a 41 y.o. male.  The history is provided by the patient and medical records.   41 y.o. M here with left knee pain.  States he tore ligaments in left knee "a while ago" but hasn't been able to see anyone to get it fixed as he does not have insurance.  Has not taken meds PTA.  Seen here frequently for same.  No new falls/trauma.  Home Medications Prior to Admission medications   Medication Sig Start Date End Date Taking? Authorizing Provider  ibuprofen (ADVIL) 600 MG tablet Take 1 tablet (600 mg total) by mouth every 6 (six) hours as needed for moderate pain. 02/17/22   Fayrene Helper, PA-C  multivitamin (ONE-A-DAY MEN'S) TABS tablet Take 1 tablet by mouth daily. 04/30/22 05/30/22  Sloan Leiter, DO      Allergies    Patient has no known allergies.    Review of Systems   Review of Systems  Musculoskeletal:  Positive for arthralgias.  All other systems reviewed and are negative.   Physical Exam Updated Vital Signs BP (!) 127/91 (BP Location: Right Arm)   Pulse 67   Temp (!) 97.5 F (36.4 C) (Oral)   Resp 16   SpO2 100%   Physical Exam Vitals and nursing note reviewed.  Constitutional:      Appearance: He is well-developed.     Comments: Breath smells of EtOH  HENT:     Head: Normocephalic and atraumatic.  Eyes:     Conjunctiva/sclera: Conjunctivae normal.     Pupils: Pupils are equal, round, and reactive to light.  Cardiovascular:     Rate and Rhythm: Normal rate and regular rhythm.     Heart sounds: Normal heart sounds.  Pulmonary:     Effort: Pulmonary effort is normal. No respiratory distress.     Breath sounds: Normal breath sounds. No rhonchi.  Musculoskeletal:        General: Normal range of motion.     Cervical back: Normal range of motion.      Comments: Able to flex/extend left knee as normal on exam, no noted swelling or bony deformity, DP pulse intact  Skin:    General: Skin is warm and dry.  Neurological:     Mental Status: He is alert and oriented to person, place, and time.     ED Results / Procedures / Treatments   Labs (all labs ordered are listed, but only abnormal results are displayed) Labs Reviewed - No data to display  EKG None  Radiology No results found.  Procedures Procedures    Medications Ordered in ED Medications - No data to display  ED Course/ Medical Decision Making/ A&P                           Medical Decision Making Risk Prescription drug management.   41 year old male here with chronic left knee pain.  Reports he tore ligaments in his knee "a while ago".  Denies any new injury or trauma.  He is able to flex and extend during exam without difficulty.  He has no bony deformity or swelling.  Leg is neurovascularly intact.  Do not feel he needs repeat imaging at this time.  Stable for discharge with  symptomatic care.  Given orthopedic follow-up as well as wellness clinic as he does not currently have insurance.  Can return here for new concerns.  Final Clinical Impression(s) / ED Diagnoses Final diagnoses:  Chronic pain of left knee    Rx / DC Orders ED Discharge Orders     None         Garlon Hatchet, PA-C 05/10/22 0231    Mesner, Barbara Cower, MD 05/10/22 [phone]

## 2022-05-10 NOTE — Discharge Instructions (Addendum)
Can take tyelnol or motrin as needed for pain. Follow-up with Dr. Ave Filter about your knee.  Call for appt. You can also be seen at wellness clinic.-- this is a free clinic.

## 2022-05-10 NOTE — ED Triage Notes (Signed)
Patient reports chronic left knee pain for several months ,worse today , denies recent injury.

## 2022-07-02 ENCOUNTER — Encounter (HOSPITAL_COMMUNITY): Payer: Self-pay | Admitting: Emergency Medicine

## 2022-07-02 ENCOUNTER — Other Ambulatory Visit: Payer: Self-pay

## 2022-07-02 ENCOUNTER — Emergency Department (HOSPITAL_COMMUNITY)
Admission: EM | Admit: 2022-07-02 | Discharge: 2022-07-02 | Disposition: A | Discharge status: Home / Self Care | Payer: Self-pay | Attending: Emergency Medicine | Admitting: Emergency Medicine

## 2022-07-02 DIAGNOSIS — R21 Rash and other nonspecific skin eruption: Secondary | ICD-10-CM | POA: Insufficient documentation

## 2022-07-02 MED ORDER — CEPHALEXIN 250 MG PO CAPS
1000.0000 mg | ORAL_CAPSULE | Freq: Once | ORAL | Status: AC
Start: 2022-07-02 — End: 2022-07-02
  Administered 2022-07-02: 1000 mg via ORAL
  Filled 2022-07-02: qty 4

## 2022-07-02 MED ORDER — IBUPROFEN 400 MG PO TABS
400.0000 mg | ORAL_TABLET | Freq: Four times a day (QID) | ORAL | 0 refills | Status: DC | PRN
Start: 2022-07-02 — End: 2022-09-29

## 2022-07-02 MED ORDER — NYSTATIN 100000 UNIT/GM EX CREA: 1.0000 | TOPICAL_CREAM | Freq: Four times a day (QID) | CUTANEOUS | 2 refills | Status: AC

## 2022-07-02 MED ORDER — IBUPROFEN 800 MG PO TABS
800.0000 mg | ORAL_TABLET | Freq: Once | ORAL | Status: AC
  Administered 2022-07-02: 800 mg via ORAL
  Filled 2022-07-02: qty 1

## 2022-07-02 MED ORDER — CEPHALEXIN 500 MG PO CAPS: 500.0000 mg | ORAL_CAPSULE | Freq: Four times a day (QID) | ORAL | 0 refills | Status: DC

## 2022-07-02 MED ORDER — DIPHENHYDRAMINE HCL 25 MG PO TABS: 25.0000 mg | ORAL_TABLET | Freq: Four times a day (QID) | ORAL | 0 refills | Status: AC | PRN

## 2022-07-02 NOTE — ED Provider Notes (Signed)
[facility] EMERGENCY DEPARTMENT Provider Note   CSN: [id number] Arrival date & time: 07/02/22  8341     History  Chief Complaint  Patient presents with   Rash    [facility] is a 41 y.o. male.  Here with burning, mildly pruritic rash to scrotum for a few months after being in jail. Hasn't tried anythign for symptoms. No h/o STD's. No penile drainage. No other associated symptoms.         Home Medications Prior to Admission medications   Medication Sig Start Date End Date Taking? Authorizing Provider  cephALEXin (KEFLEX) 500 MG capsule Take 1 capsule (500 mg total) by mouth 4 (four) times daily. 07/02/22  Yes Tyshauna Finkbiner, Barbara Cower, MD  diphenhydrAMINE (BENADRYL) 25 MG tablet Take 1 tablet (25 mg total) by mouth every 6 (six) hours as needed. 07/02/22  Yes Larkyn Greenberger, Barbara Cower, MD  ibuprofen (ADVIL) 400 MG tablet Take 1 tablet (400 mg total) by mouth every 6 (six) hours as needed. 07/02/22  Yes Inola Lisle, Barbara Cower, MD  nystatin cream (MYCOSTATIN) Apply 1 Application topically 4 (four) times daily. Apply to affected area every 4-6 hours x 10 days 07/02/22  Yes Elivia Robotham, Barbara Cower, MD      Allergies    Patient has no known allergies.    Review of Systems   Review of Systems  Physical Exam Updated Vital Signs BP 113/80 (BP Location: Right Arm)   Pulse 89   Temp 97.7 F (36.5 C) (Oral)   Resp 18   SpO2 100%  Physical Exam Vitals and nursing note reviewed.  Constitutional:      Appearance: He is well-developed.  HENT:     Head: Normocephalic and atraumatic.     Mouth/Throat:     Mouth: Mucous membranes are moist.     Pharynx: Oropharynx is clear.  Eyes:     Pupils: Pupils are equal, round, and reactive to light.  Cardiovascular:     Rate and Rhythm: Normal rate.  Pulmonary:     Effort: Pulmonary effort is normal. No respiratory distress.  Abdominal:     General: There is no distension.  Genitourinary:    Comments: A few small papules with surrounding erythema and warmth  to caudal surface of scrotum. No testicular pain. No rash elsewhere. No urethritis.  Musculoskeletal:        General: Normal range of motion.     Cervical back: Normal range of motion.  Skin:    General: Skin is warm and dry.  Neurological:     General: No focal deficit present.     Mental Status: He is alert.     ED Results / Procedures / Treatments   Labs (all labs ordered are listed, but only abnormal results are displayed) Labs Reviewed - No data to display  EKG None  Radiology No results found.  Procedures Procedures    Medications Ordered in ED Medications  cephALEXin (KEFLEX) capsule 1,000 mg (has no administration in time range)  ibuprofen (ADVIL) tablet 800 mg (has no administration in time range)    ED Course/ Medical Decision Making/ A&P                           Medical Decision Making Risk OTC drugs. Prescription drug management.   Mild cellulitis vs yeast. Will treat for both with symptomatic care otherwise.   Final Clinical Impression(s) / ED Diagnoses Final diagnoses:  Rash and nonspecific skin eruption    Rx /  DC Orders ED Discharge Orders          Ordered    cephALEXin (KEFLEX) 500 MG capsule  4 times daily        07/02/22 0652    diphenhydrAMINE (BENADRYL) 25 MG tablet  Every 6 hours PRN        07/02/22 0652    ibuprofen (ADVIL) 400 MG tablet  Every 6 hours PRN        07/02/22 0652    nystatin cream (MYCOSTATIN)  4 times daily        07/02/22 0652              Jakari Sada, Barbara Cower, MD 07/02/22 [phone]

## 2022-07-02 NOTE — ED Triage Notes (Addendum)
Patient reports chronic skin rashes at his genital area for several months .

## 2022-07-02 NOTE — ED Notes (Signed)
Pt provided written and verbal d/c instructions and prints RX. Pt verbalizes understanding Pt left dept via ambulation.

## 2022-07-05 ENCOUNTER — Emergency Department (HOSPITAL_COMMUNITY)
Admission: EM | Admit: 2022-07-05 | Discharge: 2022-07-05 | Disposition: A | Discharge status: Home / Self Care | Payer: Self-pay | Attending: Emergency Medicine | Admitting: Emergency Medicine

## 2022-07-05 ENCOUNTER — Other Ambulatory Visit: Payer: Self-pay

## 2022-07-05 ENCOUNTER — Encounter (HOSPITAL_COMMUNITY): Payer: Self-pay

## 2022-07-05 DIAGNOSIS — Y907 Blood alcohol level of 200-239 mg/100 ml: Secondary | ICD-10-CM | POA: Insufficient documentation

## 2022-07-05 DIAGNOSIS — Z5900 Homelessness unspecified: Secondary | ICD-10-CM | POA: Insufficient documentation

## 2022-07-05 DIAGNOSIS — F10920 Alcohol use, unspecified with intoxication, uncomplicated: Secondary | ICD-10-CM | POA: Insufficient documentation

## 2022-07-05 DIAGNOSIS — E860 Dehydration: Secondary | ICD-10-CM | POA: Insufficient documentation

## 2022-07-05 LAB — CBC WITH DIFFERENTIAL/PLATELET
Abs Immature Granulocytes: 0.01 10*3/uL (ref 0.00–0.07)
Basophils Absolute: 0 10*3/uL (ref 0.0–0.1)
Basophils Relative: 0 %
Eosinophils Absolute: 0.2 10*3/uL (ref 0.0–0.5)
Eosinophils Relative: 3 %
HCT: 39.3 % (ref 39.0–52.0)
Hemoglobin: 13.2 g/dL (ref 13.0–17.0)
Immature Granulocytes: 0 %
Lymphocytes Relative: 58 %
Lymphs Abs: 3.8 10*3/uL (ref 0.7–4.0)
MCH: 30.3 pg (ref 26.0–34.0)
MCHC: 33.6 g/dL (ref 30.0–36.0)
MCV: 90.3 fL (ref 80.0–100.0)
Monocytes Absolute: 0.6 10*3/uL (ref 0.1–1.0)
Monocytes Relative: 9 %
Neutro Abs: 2 10*3/uL (ref 1.7–7.7)
Neutrophils Relative %: 30 %
Platelets: 200 10*3/uL (ref 150–400)
RBC: 4.35 MIL/uL (ref 4.22–5.81)
RDW: 12.5 % (ref 11.5–15.5)
WBC: 6.6 10*3/uL (ref 4.0–10.5)
nRBC: 0 % (ref 0.0–0.2)

## 2022-07-05 LAB — COMPREHENSIVE METABOLIC PANEL
ALT: 37 U/L (ref 0–44)
AST: 44 U/L — ABNORMAL HIGH (ref 15–41)
Albumin: 4.3 g/dL (ref 3.5–5.0)
Alkaline Phosphatase: 50 U/L (ref 38–126)
Anion gap: 7 (ref 5–15)
BUN: 19 mg/dL (ref 6–20)
CO2: 27 mmol/L (ref 22–32)
Calcium: 8 mg/dL — ABNORMAL LOW (ref 8.9–10.3)
Chloride: 108 mmol/L (ref 98–111)
Creatinine, Ser: 0.96 mg/dL (ref 0.61–1.24)
GFR, Estimated: >60 mL/min (ref >60)
Glucose, Bld: 99 mg/dL (ref 70–99)
Potassium: 3.7 mmol/L (ref 3.5–5.1)
Sodium: 142 mmol/L (ref 135–145)
Total Bilirubin: 0.5 mg/dL (ref 0.3–1.2)
Total Protein: 6.7 g/dL (ref 6.5–8.1)

## 2022-07-05 LAB — ETHANOL: Alcohol, Ethyl (B): 220 mg/dL — ABNORMAL HIGH (ref <10)

## 2022-07-05 LAB — SALICYLATE LEVEL: Salicylate Lvl: <7 mg/dL — ABNORMAL LOW (ref 7.0–30.0)

## 2022-07-05 LAB — ACETAMINOPHEN LEVEL: Acetaminophen (Tylenol), Serum: <10 ug/mL — ABNORMAL LOW (ref 10–30)

## 2022-07-05 LAB — CBG MONITORING, ED: Glucose-Capillary: 158 mg/dL — ABNORMAL HIGH (ref 70–99)

## 2022-07-05 MED ORDER — SODIUM CHLORIDE 0.9 % IV SOLN: INTRAVENOUS | Status: DC

## 2022-07-05 MED ORDER — SODIUM CHLORIDE 0.9 % IV BOLUS
1000.0000 mL | Freq: Once | INTRAVENOUS | Status: AC
  Administered 2022-07-05: 1000 mL via INTRAVENOUS

## 2022-07-05 NOTE — ED Triage Notes (Signed)
Arrives EMS from [facility] after being found in the mulch, intoxicated, covered in ants.

## 2022-07-05 NOTE — ED Provider Notes (Signed)
[facility]-EMERGENCY DEPT Provider Note   CSN: [id number] Arrival date & time: 07/05/22  0536     History  Chief Complaint  Patient presents with   Alcohol Intoxication    [facility] is a 41 y.o. male.  Pt is a 41 yo male with a pmhx significant for alcohol abuse.  Pt was found in the mulch at [facility] passed out.  EMS suspects alcohol use.  Pt was covered in ants upon arrival.  Pt has been waiting for 3 hours to be seen.  His nurse reports he's been asleep the whole time.  Pt is difficult to arouse when I go talk to him.       Home Medications Prior to Admission medications   Medication Sig Start Date End Date Taking? Authorizing Provider  cephALEXin (KEFLEX) 500 MG capsule Take 1 capsule (500 mg total) by mouth 4 (four) times daily. 07/02/22   Mesner, Barbara Cower, MD  diphenhydrAMINE (BENADRYL) 25 MG tablet Take 1 tablet (25 mg total) by mouth every 6 (six) hours as needed. 07/02/22   Mesner, Barbara Cower, MD  ibuprofen (ADVIL) 400 MG tablet Take 1 tablet (400 mg total) by mouth every 6 (six) hours as needed. 07/02/22   Mesner, Barbara Cower, MD  nystatin cream (MYCOSTATIN) Apply 1 Application topically 4 (four) times daily. Apply to affected area every 4-6 hours x 10 days 07/02/22   Mesner, Barbara Cower, MD      Allergies    Patient has no known allergies.    Review of Systems   Review of Systems  Unable to perform ROS: Mental status change  All other systems reviewed and are negative.   Physical Exam Updated Vital Signs BP 105/75   Pulse 65   Temp 98.6 F (37 C)   Resp 16   Ht 6\' 1"  (1.854 m)   Wt 79.4 kg   SpO2 100%   BMI 23.09 kg/m  Physical Exam Vitals and nursing note reviewed.  Constitutional:      General: He is sleeping.  HENT:     Head: Normocephalic and atraumatic.     Right Ear: External ear normal.     Left Ear: External ear normal.     Nose: Nose normal.     Mouth/Throat:     Mouth: Mucous membranes are dry.  Eyes:     Extraocular Movements:  Extraocular movements intact.     Conjunctiva/sclera: Conjunctivae normal.     Pupils: Pupils are equal, round, and reactive to light.  Cardiovascular:     Rate and Rhythm: Normal rate and regular rhythm.     Pulses: Normal pulses.     Heart sounds: Normal heart sounds.  Pulmonary:     Effort: Pulmonary effort is normal.     Breath sounds: Normal breath sounds.  Abdominal:     General: Abdomen is flat. Bowel sounds are normal.     Palpations: Abdomen is soft.  Musculoskeletal:        General: Normal range of motion.     Cervical back: Normal range of motion and neck supple.  Skin:    General: Skin is warm.     Capillary Refill: Capillary refill takes less than 2 seconds.  Neurological:     Comments: Pt is passed out in the room.  He will arouse briefly with sternal rub, but he falls right back asleep.  Unable to perform a complete neuro exam.  Psychiatric:     Comments: Unable to assess     ED  Results / Procedures / Treatments   Labs (all labs ordered are listed, but only abnormal results are displayed) Labs Reviewed  COMPREHENSIVE METABOLIC PANEL - Abnormal; Notable for the following components:      Result Value   Calcium 8.0 (*)    AST 44 (*)    All other components within normal limits  SALICYLATE LEVEL - Abnormal; Notable for the following components:   Salicylate Lvl <7.0 (*)    All other components within normal limits  ACETAMINOPHEN LEVEL - Abnormal; Notable for the following components:   Acetaminophen (Tylenol), Serum <10 (*)    All other components within normal limits  ETHANOL - Abnormal; Notable for the following components:   Alcohol, Ethyl (B) 220 (*)    All other components within normal limits  CBG MONITORING, ED - Abnormal; Notable for the following components:   Glucose-Capillary 158 (*)    All other components within normal limits  CBC WITH DIFFERENTIAL/PLATELET  RAPID URINE DRUG SCREEN, HOSP PERFORMED    EKG None  Radiology No results  found.  Procedures Procedures    Medications Ordered in ED Medications  sodium chloride 0.9 % bolus 1,000 mL (0 mLs Intravenous Stopped 07/05/22 1016)    And  0.9 %  sodium chloride infusion ( Intravenous Not Given 07/05/22 1037)    ED Course/ Medical Decision Making/ A&P                           Medical Decision Making Amount and/or Complexity of Data Reviewed Labs: ordered.  Risk Prescription drug management.   This patient presents to the ED for concern of etoh intox, this involves an extensive number of treatment options, and is a complaint that carries with it a high risk of complications and morbidity.  The differential diagnosis includes drug or alcohol intox, electrolyte abn, brain abn   Co morbidities that complicate the patient evaluation  Hx etoh abuse   Additional history obtained:  Additional history obtained from epic chart review   Lab Tests:  I Ordered, and personally interpreted labs.  The pertinent results include:  cbc nl, cmp nl, etoh 220, acet less than 10, salicylate <7     Medicines ordered and prescription drug management:  I ordered medication including ivfs  for dehydration  Reevaluation of the patient after these medicines showed that the patient improved I have reviewed the patients home medicines and have made adjustments as needed   Test Considered:  Ct, but pt is awake now and no signs of trauma   Critical Interventions:  ivfs    Problem List / ED Course:  Etoh intox:  Pt is now awake and alert.  He is eating and drinking.  He is able to ambulate.  Pt is stable for d/c.  Return if worse.   Reevaluation:  After the interventions noted above, I reevaluated the patient and found that they have :improved   Social Determinants of Health:  Homeless, no insurance   Dispostion:  After consideration of the diagnostic results and the patients response to treatment, I feel that the patent would benefit from discharge with  outpatient f/u.          Final Clinical Impression(s) / ED Diagnoses Final diagnoses:  Alcoholic intoxication without complication [facility])  Homeless    Rx / DC Orders ED Discharge Orders     None         Jacalyn Lefevre, MD 07/05/22 1042

## 2022-07-14 ENCOUNTER — Emergency Department (HOSPITAL_COMMUNITY)
Admission: EM | Admit: 2022-07-14 | Discharge: 2022-07-14 | Disposition: A | Discharge status: Home / Self Care | Payer: Self-pay | Attending: Emergency Medicine | Admitting: Emergency Medicine

## 2022-07-14 ENCOUNTER — Other Ambulatory Visit: Payer: Self-pay

## 2022-07-14 ENCOUNTER — Emergency Department (HOSPITAL_COMMUNITY): Payer: Self-pay

## 2022-07-14 ENCOUNTER — Encounter (HOSPITAL_COMMUNITY): Payer: Self-pay | Admitting: Emergency Medicine

## 2022-07-14 DIAGNOSIS — X58XXXA Exposure to other specified factors, initial encounter: Secondary | ICD-10-CM | POA: Insufficient documentation

## 2022-07-14 DIAGNOSIS — M25571 Pain in right ankle and joints of right foot: Secondary | ICD-10-CM

## 2022-07-14 DIAGNOSIS — S90511A Abrasion, right ankle, initial encounter: Secondary | ICD-10-CM | POA: Insufficient documentation

## 2022-07-14 DIAGNOSIS — G8929 Other chronic pain: Secondary | ICD-10-CM

## 2022-07-14 DIAGNOSIS — M25562 Pain in left knee: Secondary | ICD-10-CM | POA: Insufficient documentation

## 2022-07-14 MED ORDER — ACETAMINOPHEN 500 MG PO TABS
1000.0000 mg | ORAL_TABLET | Freq: Once | ORAL | Status: AC
  Administered 2022-07-14: 1000 mg via ORAL
  Filled 2022-07-14: qty 2

## 2022-07-14 NOTE — ED Notes (Signed)
Patient transported to X-ray 

## 2022-07-14 NOTE — ED Notes (Signed)
Pt given tylenol and discharge papers, upon explaining discharge instructions, pt starts cussing and refusing to leave. Security called and assisted pt to the exit.

## 2022-07-14 NOTE — Discharge Instructions (Signed)
You are seen today in the emergency department for left knee pain and right ankle pain.  You have signs of advanced degeneration in your left knee, your right ankle shows postoperative repair but no new findings.  Take Tylenol as needed for pain.  Follow-up with orthopedics as needed.  Return to ED for fever or new symptoms.

## 2022-07-14 NOTE — ED Provider Notes (Signed)
[facility] EMERGENCY DEPARTMENT Provider Note   CSN: [id number] Arrival date & time: 07/14/22  4098     History  Chief Complaint  Patient presents with   Leg Pain    [facility] is a 41 y.o. male.   Leg Pain    Patient with history of chronic left knee pain, alcohol intoxication, unstable housing/homelessness presents today due to left knee pain and right ankle pain.  He states the left knee pain has been going on for some time but worsened about a week ago when he fell.  He is also having right ankle pain which started a week ago when he fell.  He denies any paresthesias, fevers, chills, IV drug use.  He has not tried any medicine to help.  Ambulating makes the pain worse.  Home Medications Prior to Admission medications   Medication Sig Start Date End Date Taking? Authorizing Provider  cephALEXin (KEFLEX) 500 MG capsule Take 1 capsule (500 mg total) by mouth 4 (four) times daily. 07/02/22   Mesner, Barbara Cower, MD  diphenhydrAMINE (BENADRYL) 25 MG tablet Take 1 tablet (25 mg total) by mouth every 6 (six) hours as needed. 07/02/22   Mesner, Barbara Cower, MD  ibuprofen (ADVIL) 400 MG tablet Take 1 tablet (400 mg total) by mouth every 6 (six) hours as needed. 07/02/22   Mesner, Barbara Cower, MD  nystatin cream (MYCOSTATIN) Apply 1 Application topically 4 (four) times daily. Apply to affected area every 4-6 hours x 10 days 07/02/22   Mesner, Barbara Cower, MD      Allergies    Patient has no known allergies.    Review of Systems   Review of Systems  Musculoskeletal:  Positive for myalgias.    Physical Exam Updated Vital Signs BP (!) 129/96 (BP Location: Left Arm)   Pulse 87   Temp (!) 97.5 F (36.4 C)   Resp 17   SpO2 98%  Physical Exam Vitals and nursing note reviewed. Exam conducted with a chaperone present.  Constitutional:      General: He is not in acute distress.    Appearance: Normal appearance.  HENT:     Head: Normocephalic and atraumatic.  Eyes:     General: No  scleral icterus.    Extraocular Movements: Extraocular movements intact.     Pupils: Pupils are equal, round, and reactive to light.  Cardiovascular:     Pulses: Normal pulses.  Musculoskeletal:        General: No swelling or tenderness.     Comments: Tolerates passive ROM of left knee, right ankle.  No crepitus.  No posterior calf tenderness.  Skin:    Capillary Refill: Capillary refill takes less than 2 seconds.     Coloration: Skin is not jaundiced.     Findings: No rash.     Comments: Superficial abrasion to right ankle anterior.  No erythema or purulent drainage.  Neurological:     Mental Status: He is alert. Mental status is at baseline.     Coordination: Coordination normal.     ED Results / Procedures / Treatments   Labs (all labs ordered are listed, but only abnormal results are displayed) Labs Reviewed - No data to display  EKG None  Radiology DG Ankle Complete Right  Result Date: 07/14/2022 CLINICAL DATA:  41 year old male with pain but no recent injury. EXAM: RIGHT ANKLE - COMPLETE 3+ VIEW COMPARISON:  Right ankle series 10/20/2021. FINDINGS: Chronic distal tib fib ORIF. Hardware appears stable since last year, chronic lucency along  the 2 cortical screws. Maintained mortise joint alignment. Bulky chronic posttraumatic and/or degenerative osteophytosis of the medial malleolus (with chronic fragmentation), anterior tibial plafond, anterior talus. No definite joint effusion. Calcaneus appears intact. Cuboid accessory ossicle. No acute osseous abnormality identified. IMPRESSION: Chronic posttraumatic, postoperative, and degenerative changes about the ankle. No acute osseous abnormality identified. Electronically Signed   By: Odessa Fleming M.D.   On: 07/14/2022 07:30   DG Knee Complete 4 Views Right  Result Date: 07/14/2022 CLINICAL DATA:  41 year old male with pain but no recent injury. EXAM: RIGHT KNEE - COMPLETE 4+ VIEW COMPARISON:  Right knee series 02/10/2022. FINDINGS: Bone  mineralization is within normal limits. No acute osseous abnormality identified. No evidence of knee joint effusion. Mild chronic tricompartmental degenerative spurring seems advanced for age, most pronounced in the lateral compartment. 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 No acute osseous abnormality identified. Electronically Signed   By: Odessa Fleming M.D.   On: 07/14/2022 07:28    Procedures Procedures    Medications Ordered in ED Medications  acetaminophen (TYLENOL) tablet 1,000 mg (1,000 mg Oral Given 07/14/22 0749)    ED Course/ Medical Decision Making/ A&P                           Medical Decision Making Amount and/or Complexity of Data Reviewed Radiology: ordered.  Risk OTC drugs.   Patient presents due to right ankle pain and left knee pain.  Differential includes not limited to fracture, dislocation, septic joint, cellulitis, DVT.  On exam patient is neurovascular intact with wrist cap refill.  There is a superficial abrasion but no surrounding erythema or purulence I be suggestive of infectious process or cellulitis.  No posterior calf tenderness, pain is very reproducible with ROM.  Tolerates passive ROM so I do not think this is consistent with septic joint.  Exam and presentation not consistent with DVT.  I ordered, reviewed and interpreted plain film of ankle and knee.  Agree with radiologist interpretation.  Negative for any acute process.  Degenerative changes in the knee which we discussed.  Also right ankle with some postoperative changes but nothing acute.  I ordered Tylenol for the patient.  I reviewed external records, patient is very well-known to the emergency department due to frequent ED visits.  I think there is a level of social determinants of health including his homelessness and patient's care.  I reevaluate the patient who is resting comfortably. No real change in pain level but no worsening. Consider antibiotics to the abrasion of there is  no signs of cellulitis, purulence or infectious process so I do not feel antibiotics indicated.  Patient stable for discharge at this time.        Final Clinical Impression(s) / ED Diagnoses Final diagnoses:  Chronic pain of left knee  Right ankle pain, unspecified chronicity    Rx / DC Orders ED Discharge Orders     None         Theron Arista, PA-C 07/14/22 0756    Wynetta Fines, MD 07/14/22 [phone]

## 2022-07-14 NOTE — ED Triage Notes (Signed)
Patient having pain in right lower leg.  Patient has redness around wound to lower leg.

## 2022-09-15 IMAGING — DX DG KNEE 1-2V*L*
2 series · 3 of 3 positions shown · non-contrast
Comparison: 02/06/2022

CLINICAL DATA: Chronic left knee pain

EXAM:
LEFT KNEE - 2 VIEW

[knee ap]
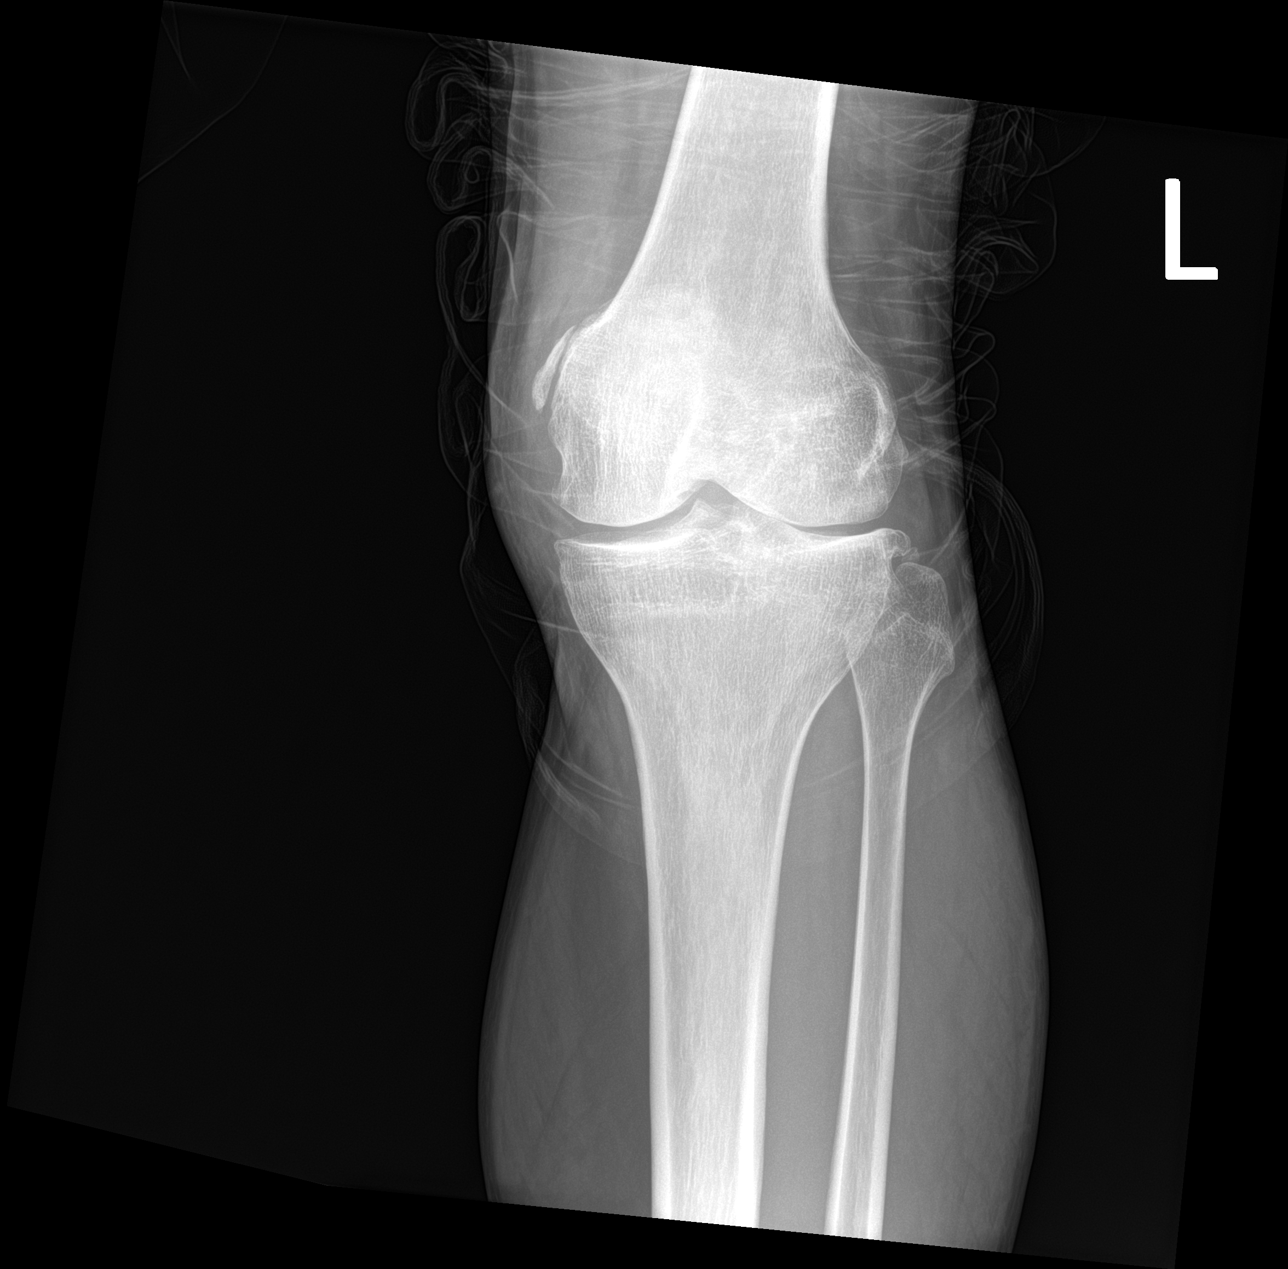

[Series 2: knee lat · 0.14mm/px · 2 of 2 slices shown]
[im 1/2]
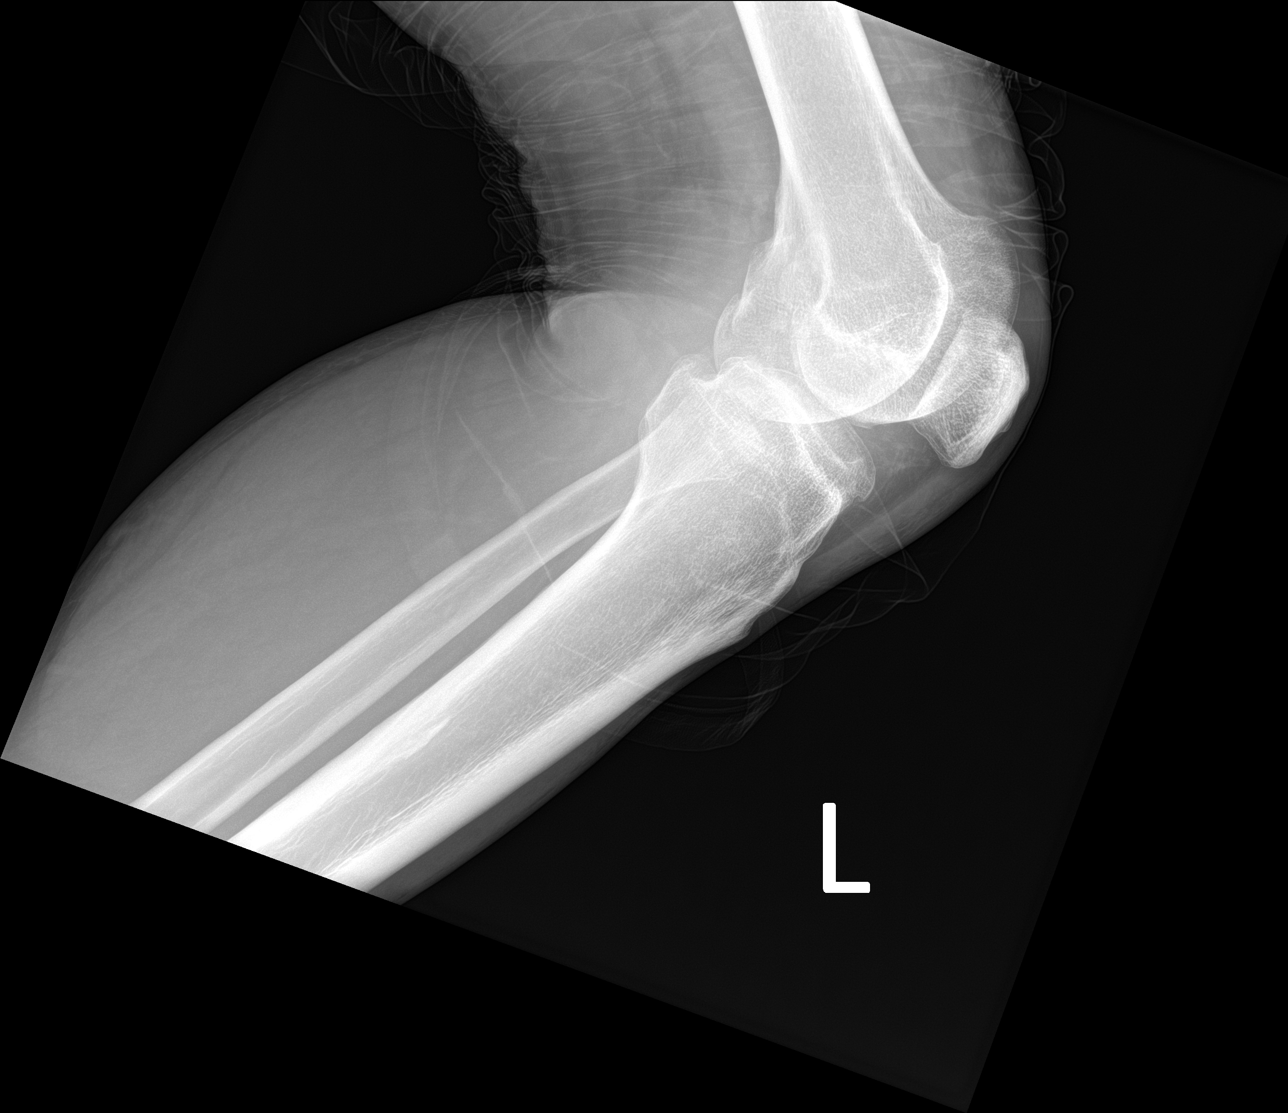
[im 2/2]
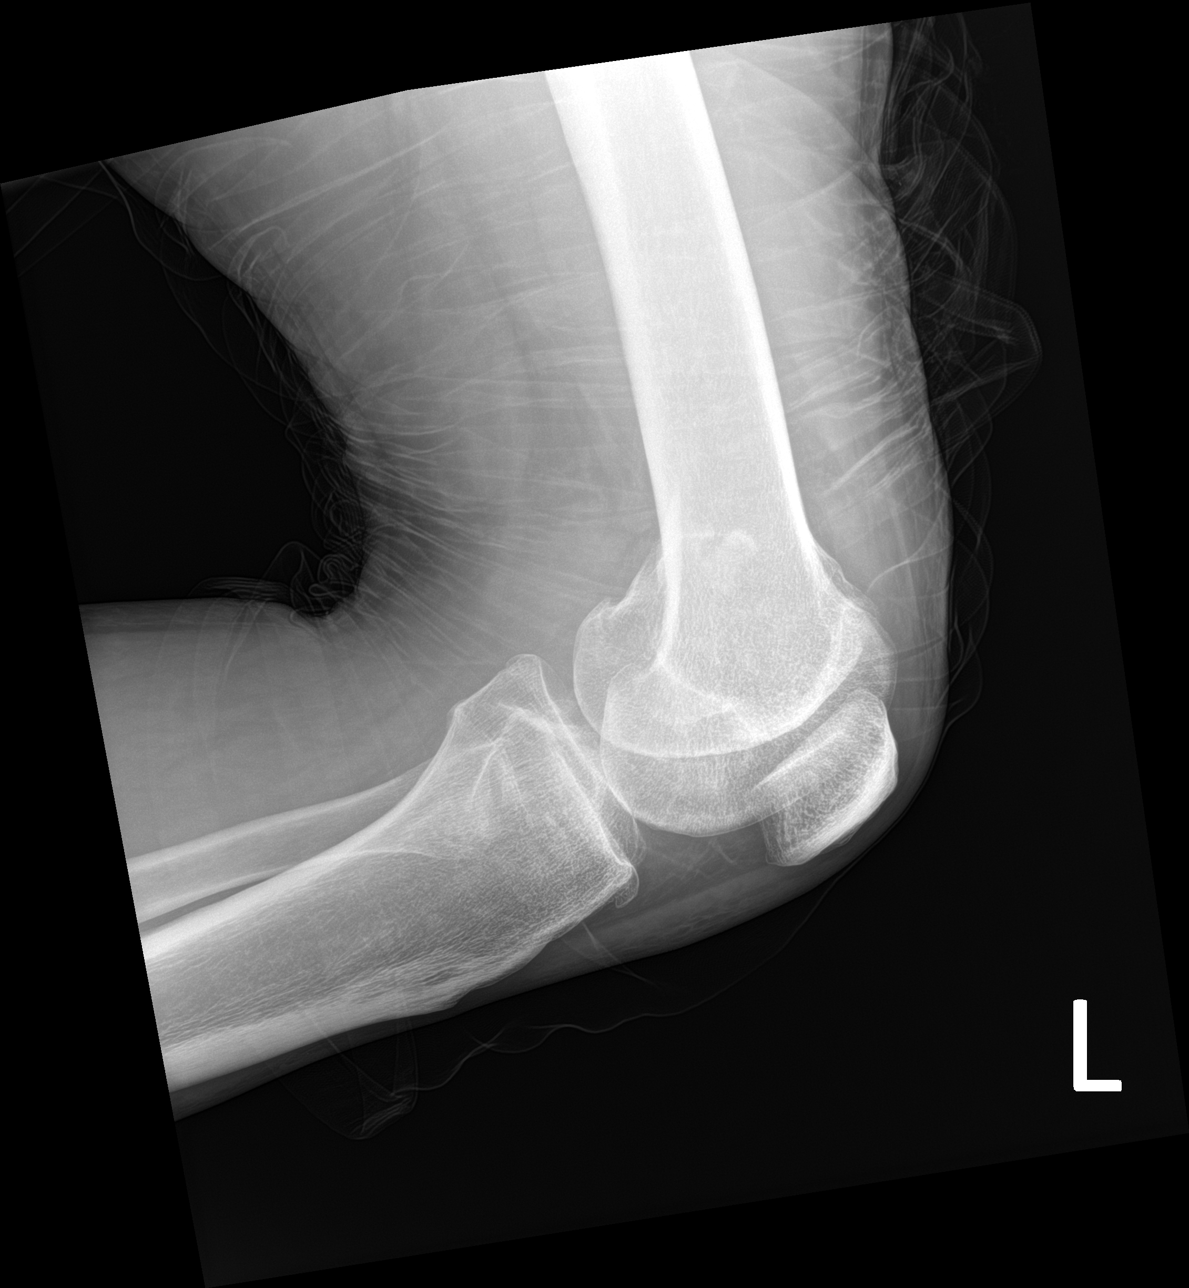

[3 of 3 positions shown; findings below may reference images not displayed]

FINDINGS: Degenerative changes are noted in all 3 joint compartments.
Calcification along the superior aspect of the medial collateral
ligament is noted and stable. No acute fracture or dislocation is
seen. No soft tissue abnormality is noted.
IMPRESSION: No acute abnormality noted. Chronic degenerative changes are again
seen and stable.

## 2022-09-25 ENCOUNTER — Emergency Department (HOSPITAL_COMMUNITY)
Admission: EM | Admit: 2022-09-25 | Discharge: 2022-09-26 | Disposition: A | Discharge status: Home / Self Care | Payer: Self-pay | Attending: Emergency Medicine | Admitting: Emergency Medicine

## 2022-09-25 DIAGNOSIS — S40811A Abrasion of right upper arm, initial encounter: Secondary | ICD-10-CM | POA: Insufficient documentation

## 2022-09-25 DIAGNOSIS — Y908 Blood alcohol level of 240 mg/100 ml or more: Secondary | ICD-10-CM | POA: Insufficient documentation

## 2022-09-25 DIAGNOSIS — S40812A Abrasion of left upper arm, initial encounter: Secondary | ICD-10-CM | POA: Insufficient documentation

## 2022-09-25 DIAGNOSIS — Z79899 Other long term (current) drug therapy: Secondary | ICD-10-CM | POA: Insufficient documentation

## 2022-09-25 DIAGNOSIS — S0083XA Contusion of other part of head, initial encounter: Secondary | ICD-10-CM | POA: Insufficient documentation

## 2022-09-26 ENCOUNTER — Encounter (HOSPITAL_COMMUNITY): Payer: Self-pay | Admitting: *Deleted

## 2022-09-26 ENCOUNTER — Other Ambulatory Visit: Payer: Self-pay

## 2022-09-26 ENCOUNTER — Emergency Department (HOSPITAL_COMMUNITY): Payer: Self-pay

## 2022-09-26 LAB — COMPREHENSIVE METABOLIC PANEL
ALT: 17 U/L (ref 0–44)
AST: 30 U/L (ref 15–41)
Albumin: 4.2 g/dL (ref 3.5–5.0)
Alkaline Phosphatase: 46 U/L (ref 38–126)
Anion gap: 15 (ref 5–15)
BUN: 14 mg/dL (ref 6–20)
CO2: 21 mmol/L — ABNORMAL LOW (ref 22–32)
Calcium: 8.5 mg/dL — ABNORMAL LOW (ref 8.9–10.3)
Chloride: 107 mmol/L (ref 98–111)
Creatinine, Ser: 0.9 mg/dL (ref 0.61–1.24)
GFR, Estimated: >60 mL/min (ref >60)
Glucose, Bld: 103 mg/dL — ABNORMAL HIGH (ref 70–99)
Potassium: 3.7 mmol/L (ref 3.5–5.1)
Sodium: 143 mmol/L (ref 135–145)
Total Bilirubin: 0.4 mg/dL (ref 0.3–1.2)
Total Protein: 6.7 g/dL (ref 6.5–8.1)

## 2022-09-26 LAB — CBC WITH DIFFERENTIAL/PLATELET
Abs Immature Granulocytes: 0.02 10*3/uL (ref 0.00–0.07)
Basophils Absolute: 0 10*3/uL (ref 0.0–0.1)
Basophils Relative: 0 %
Eosinophils Absolute: 0 10*3/uL (ref 0.0–0.5)
Eosinophils Relative: 0 %
HCT: 40 % (ref 39.0–52.0)
Hemoglobin: 13.9 g/dL (ref 13.0–17.0)
Immature Granulocytes: 0 %
Lymphocytes Relative: 16 %
Lymphs Abs: 1.6 10*3/uL (ref 0.7–4.0)
MCH: 29.8 pg (ref 26.0–34.0)
MCHC: 34.8 g/dL (ref 30.0–36.0)
MCV: 85.7 fL (ref 80.0–100.0)
Monocytes Absolute: 0.7 10*3/uL (ref 0.1–1.0)
Monocytes Relative: 7 %
Neutro Abs: 8 10*3/uL — ABNORMAL HIGH (ref 1.7–7.7)
Neutrophils Relative %: 77 %
Platelets: 191 10*3/uL (ref 150–400)
RBC: 4.67 MIL/uL (ref 4.22–5.81)
RDW: 12.3 % (ref 11.5–15.5)
WBC: 10.4 10*3/uL (ref 4.0–10.5)
nRBC: 0 % (ref 0.0–0.2)

## 2022-09-26 LAB — RAPID URINE DRUG SCREEN, HOSP PERFORMED
Amphetamines: NOT DETECTED
Barbiturates: NOT DETECTED
Benzodiazepines: NOT DETECTED
Cocaine: NOT DETECTED
Opiates: NOT DETECTED
Tetrahydrocannabinol: POSITIVE — AB

## 2022-09-26 LAB — ETHANOL: Alcohol, Ethyl (B): 250 mg/dL — ABNORMAL HIGH (ref <10)

## 2022-09-26 MED ORDER — BACITRACIN ZINC 500 UNIT/GM EX OINT
TOPICAL_OINTMENT | Freq: Two times a day (BID) | CUTANEOUS | Status: DC
  Filled 2022-09-26: qty 0.9

## 2022-09-26 MED ORDER — ACETAMINOPHEN 500 MG PO TABS
1000.0000 mg | ORAL_TABLET | Freq: Once | ORAL | Status: AC
  Administered 2022-09-26: 1000 mg via ORAL
  Filled 2022-09-26: qty 2

## 2022-09-26 NOTE — ED Triage Notes (Signed)
The [place] by [place] from somewhere in town  pt is homeless he  has  a laceration over his rt eye   both pupils equal  and react to light alert and oriented x 4 moves all extremities laughing and joking around
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
## 2022-09-26 NOTE — ED Notes (Signed)
Pt ambulated to restroom with steady gait and no issues to provide urine sample.

## 2022-09-27 ENCOUNTER — Emergency Department (HOSPITAL_COMMUNITY)
Admission: EM | Admit: 2022-09-27 | Discharge: 2022-09-27 | Disposition: A | Discharge status: Home / Self Care | Payer: Self-pay | Attending: Emergency Medicine | Admitting: Emergency Medicine

## 2022-09-27 ENCOUNTER — Encounter (HOSPITAL_COMMUNITY): Payer: Self-pay

## 2022-09-27 ENCOUNTER — Other Ambulatory Visit: Payer: Self-pay

## 2022-09-27 ENCOUNTER — Other Ambulatory Visit (HOSPITAL_COMMUNITY): Payer: Self-pay

## 2022-09-27 DIAGNOSIS — Z5900 Homelessness unspecified: Secondary | ICD-10-CM | POA: Insufficient documentation

## 2022-09-27 DIAGNOSIS — R21 Rash and other nonspecific skin eruption: Secondary | ICD-10-CM | POA: Insufficient documentation

## 2022-09-27 LAB — RAPID HIV SCREEN (HIV 1/2 AB+AG)
HIV 1/2 Antibodies: NONREACTIVE
HIV-1 P24 Antigen - HIV24: NONREACTIVE

## 2022-09-27 LAB — URINALYSIS, ROUTINE W REFLEX MICROSCOPIC
Bilirubin Urine: NEGATIVE
Glucose, UA: NEGATIVE mg/dL
Ketones, ur: NEGATIVE mg/dL
Leukocytes,Ua: NEGATIVE
Nitrite: NEGATIVE
Protein, ur: NEGATIVE mg/dL
Specific Gravity, Urine: 1.02 (ref 1.005–1.030)
pH: 5.5 (ref 5.0–8.0)

## 2022-09-27 LAB — URINALYSIS, MICROSCOPIC (REFLEX)

## 2022-09-27 LAB — RPR: RPR Ser Ql: NONREACTIVE

## 2022-09-27 MED ORDER — DOXYCYCLINE HYCLATE 100 MG PO TABS
100.0000 mg | ORAL_TABLET | Freq: Once | ORAL | Status: AC
  Administered 2022-09-27: 100 mg via ORAL
  Filled 2022-09-27: qty 1

## 2022-09-27 MED ORDER — DOXYCYCLINE HYCLATE 100 MG PO CAPS
100.0000 mg | ORAL_CAPSULE | Freq: Two times a day (BID) | ORAL | 0 refills | Status: DC
  Filled 2022-09-27: qty 20, 10d supply, fill #0

## 2022-09-27 NOTE — ED Notes (Signed)
Pt refused vitals, he is extremely agitated and verbally aggressive with staff.

## 2022-09-27 NOTE — Discharge Instructions (Addendum)
You may take antibiotic prescribed for your rash.  If you notice no improvement, follow-up with your primary care doctor for further care.

## 2022-09-27 NOTE — ED Notes (Signed)
AVS with prescriptions provided to and discussed with patient. Pt verbalizes understanding of discharge instructions and denies any questions or concerns at this time. Pt ambulated out of department independently with steady gait. ? ?
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
## 2022-09-28 ENCOUNTER — Other Ambulatory Visit: Payer: Self-pay

## 2022-09-28 ENCOUNTER — Emergency Department (HOSPITAL_COMMUNITY)
Admission: EM | Admit: 2022-09-28 | Discharge: 2022-09-28 | Disposition: A | Discharge status: Home / Self Care | Payer: Self-pay | Attending: Emergency Medicine | Admitting: Emergency Medicine

## 2022-09-28 DIAGNOSIS — G8929 Other chronic pain: Secondary | ICD-10-CM | POA: Insufficient documentation

## 2022-09-28 DIAGNOSIS — M25571 Pain in right ankle and joints of right foot: Secondary | ICD-10-CM | POA: Insufficient documentation

## 2022-09-28 LAB — GC/CHLAMYDIA PROBE AMP (~~LOC~~) NOT AT ARMC
Chlamydia: NEGATIVE
Comment: NEGATIVE
Comment: NORMAL
Neisseria Gonorrhea: NEGATIVE

## 2022-09-28 MED ORDER — ACETAMINOPHEN 325 MG PO TABS
650.0000 mg | ORAL_TABLET | Freq: Once | ORAL | Status: AC
  Administered 2022-09-28: 650 mg via ORAL
  Filled 2022-09-28: qty 2
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
## 2022-09-28 NOTE — Discharge Instructions (Signed)
You were seen in the ER for your ankle pain. You may take tylenol or ibuprofen as needed.
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
## 2022-09-29 ENCOUNTER — Emergency Department (HOSPITAL_COMMUNITY)
Admission: EM | Admit: 2022-09-29 | Discharge: 2022-09-29 | Disposition: A | Discharge status: Home / Self Care | Payer: Self-pay | Attending: Emergency Medicine | Admitting: Emergency Medicine

## 2022-09-29 ENCOUNTER — Encounter (HOSPITAL_COMMUNITY): Payer: Self-pay

## 2022-09-29 ENCOUNTER — Emergency Department (HOSPITAL_COMMUNITY): Payer: Self-pay

## 2022-09-29 DIAGNOSIS — X58XXXA Exposure to other specified factors, initial encounter: Secondary | ICD-10-CM | POA: Insufficient documentation

## 2022-09-29 DIAGNOSIS — S93401A Sprain of unspecified ligament of right ankle, initial encounter: Secondary | ICD-10-CM | POA: Insufficient documentation

## 2022-09-29 MED ORDER — IBUPROFEN 400 MG PO TABS: 400.0000 mg | ORAL_TABLET | Freq: Four times a day (QID) | ORAL | 0 refills | Status: DC | PRN
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
## 2022-10-07 ENCOUNTER — Other Ambulatory Visit (HOSPITAL_COMMUNITY): Payer: Self-pay

## 2022-10-08 ENCOUNTER — Other Ambulatory Visit (HOSPITAL_COMMUNITY): Payer: Self-pay

## 2022-10-09 ENCOUNTER — Emergency Department (HOSPITAL_COMMUNITY): Payer: Self-pay

## 2022-10-09 ENCOUNTER — Other Ambulatory Visit: Payer: Self-pay

## 2022-10-09 ENCOUNTER — Emergency Department (HOSPITAL_COMMUNITY)
Admission: EM | Admit: 2022-10-09 | Discharge: 2022-10-09 | Disposition: A | Discharge status: Home / Self Care | Payer: Self-pay | Attending: Emergency Medicine | Admitting: Emergency Medicine

## 2022-10-09 DIAGNOSIS — Y907 Blood alcohol level of 200-239 mg/100 ml: Secondary | ICD-10-CM | POA: Insufficient documentation

## 2022-10-09 DIAGNOSIS — D72829 Elevated white blood cell count, unspecified: Secondary | ICD-10-CM | POA: Insufficient documentation

## 2022-10-09 DIAGNOSIS — Y92410 Unspecified street and highway as the place of occurrence of the external cause: Secondary | ICD-10-CM | POA: Insufficient documentation

## 2022-10-09 DIAGNOSIS — Y9389 Activity, other specified: Secondary | ICD-10-CM | POA: Insufficient documentation

## 2022-10-09 DIAGNOSIS — Z20822 Contact with and (suspected) exposure to covid-19: Secondary | ICD-10-CM | POA: Insufficient documentation

## 2022-10-09 DIAGNOSIS — R7309 Other abnormal glucose: Secondary | ICD-10-CM | POA: Insufficient documentation

## 2022-10-09 DIAGNOSIS — S80912A Unspecified superficial injury of left knee, initial encounter: Secondary | ICD-10-CM | POA: Insufficient documentation

## 2022-10-09 DIAGNOSIS — J329 Chronic sinusitis, unspecified: Secondary | ICD-10-CM | POA: Insufficient documentation

## 2022-10-09 DIAGNOSIS — F10920 Alcohol use, unspecified with intoxication, uncomplicated: Secondary | ICD-10-CM | POA: Insufficient documentation

## 2022-10-09 DIAGNOSIS — M25562 Pain in left knee: Secondary | ICD-10-CM | POA: Insufficient documentation

## 2022-10-09 LAB — CBC WITH DIFFERENTIAL/PLATELET
Abs Immature Granulocytes: 0.06 10*3/uL (ref 0.00–0.07)
Basophils Absolute: 0 10*3/uL (ref 0.0–0.1)
Basophils Relative: 0 %
Eosinophils Absolute: 0 10*3/uL (ref 0.0–0.5)
Eosinophils Relative: 0 %
HCT: 38.4 % — ABNORMAL LOW (ref 39.0–52.0)
Hemoglobin: 13 g/dL (ref 13.0–17.0)
Immature Granulocytes: 0 %
Lymphocytes Relative: 20 %
Lymphs Abs: 2.9 10*3/uL (ref 0.7–4.0)
MCH: 29.7 pg (ref 26.0–34.0)
MCHC: 33.9 g/dL (ref 30.0–36.0)
MCV: 87.9 fL (ref 80.0–100.0)
Monocytes Absolute: 0.9 10*3/uL (ref 0.1–1.0)
Monocytes Relative: 6 %
Neutro Abs: 10.8 10*3/uL — ABNORMAL HIGH (ref 1.7–7.7)
Neutrophils Relative %: 74 %
Platelets: 344 10*3/uL (ref 150–400)
RBC: 4.37 MIL/uL (ref 4.22–5.81)
RDW: 13.2 % (ref 11.5–15.5)
WBC: 14.6 10*3/uL — ABNORMAL HIGH (ref 4.0–10.5)
nRBC: 0 % (ref 0.0–0.2)

## 2022-10-09 LAB — COMPREHENSIVE METABOLIC PANEL
ALT: 35 U/L (ref 0–44)
AST: 43 U/L — ABNORMAL HIGH (ref 15–41)
Albumin: 3.6 g/dL (ref 3.5–5.0)
Alkaline Phosphatase: 79 U/L (ref 38–126)
Anion gap: 13 (ref 5–15)
BUN: 7 mg/dL (ref 6–20)
CO2: 28 mmol/L (ref 22–32)
Calcium: 8.6 mg/dL — ABNORMAL LOW (ref 8.9–10.3)
Chloride: 98 mmol/L (ref 98–111)
Creatinine, Ser: 0.86 mg/dL (ref 0.61–1.24)
GFR, Estimated: >60 mL/min (ref >60)
Glucose, Bld: 123 mg/dL — ABNORMAL HIGH (ref 70–99)
Potassium: 3.7 mmol/L (ref 3.5–5.1)
Sodium: 139 mmol/L (ref 135–145)
Total Bilirubin: 0.6 mg/dL (ref 0.3–1.2)
Total Protein: 6.9 g/dL (ref 6.5–8.1)

## 2022-10-09 LAB — I-STAT CHEM 8, ED
BUN: 6 mg/dL (ref 6–20)
Calcium, Ion: 0.99 mmol/L — ABNORMAL LOW (ref 1.15–1.40)
Chloride: 98 mmol/L (ref 98–111)
Creatinine, Ser: 1.1 mg/dL (ref 0.61–1.24)
Glucose, Bld: 120 mg/dL — ABNORMAL HIGH (ref 70–99)
HCT: 39 % (ref 39.0–52.0)
Hemoglobin: 13.3 g/dL (ref 13.0–17.0)
Potassium: 3.7 mmol/L (ref 3.5–5.1)
Sodium: 138 mmol/L (ref 135–145)
TCO2: 27 mmol/L (ref 22–32)

## 2022-10-09 LAB — RESP PANEL BY RT-PCR (RSV, FLU A&B, COVID)  RVPGX2
Influenza A by PCR: NEGATIVE
Influenza B by PCR: NEGATIVE
Resp Syncytial Virus by PCR: NEGATIVE
SARS Coronavirus 2 by RT PCR: NEGATIVE

## 2022-10-09 LAB — SALICYLATE LEVEL: Salicylate Lvl: <7 mg/dL — ABNORMAL LOW (ref 7.0–30.0)

## 2022-10-09 LAB — ETHANOL: Alcohol, Ethyl (B): 239 mg/dL — ABNORMAL HIGH (ref <10)

## 2022-10-09 LAB — ACETAMINOPHEN LEVEL: Acetaminophen (Tylenol), Serum: <10 ug/mL — ABNORMAL LOW (ref 10–30)

## 2022-10-09 MED ORDER — ACETAMINOPHEN 325 MG PO TABS: 650.0000 mg | ORAL_TABLET | Freq: Once | ORAL | Status: DC

## 2022-10-09 MED ORDER — SODIUM CHLORIDE 0.9 % IV BOLUS
1000.0000 mL | Freq: Once | INTRAVENOUS | Status: AC
  Administered 2022-10-09: 1000 mL via INTRAVENOUS

## 2022-10-09 MED ORDER — AMOXICILLIN-POT CLAVULANATE 875-125 MG PO TABS: 1.0000 | ORAL_TABLET | Freq: Two times a day (BID) | ORAL | 0 refills | Status: DC

## 2022-10-09 MED ORDER — ACETAMINOPHEN 500 MG PO TABS
1000.0000 mg | ORAL_TABLET | Freq: Once | ORAL | Status: AC
  Administered 2022-10-09: 1000 mg via ORAL
  Filled 2022-10-09: qty 2

## 2022-10-09 MED ORDER — AMOXICILLIN-POT CLAVULANATE 875-125 MG PO TABS
1.0000 | ORAL_TABLET | Freq: Once | ORAL | Status: AC
  Administered 2022-10-09: 1 via ORAL
  Filled 2022-10-09: qty 1
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
## 2022-10-09 NOTE — ED Notes (Signed)
Pt resting quietly with eyes closed
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
## 2022-10-09 NOTE — ED Notes (Addendum)
PA informed face to face of temp of 102.13f. Agrees to place order for Tylenol. Apple juice, water, and graham crackers provided.

## 2022-10-09 NOTE — ED Provider Notes (Signed)
Care transferred from [place], PA-C at time of sign out. See their note for full assessment.   Briefly: Patient is 41 y.o. male who presents to the ED with concerns for knee injury.     Plan: Plan per previous PA-C: Pt was sent Augmentin for sinusitis. Pt intoxicated on arrival, pt to sober up prior to discharge.   Labs Reviewed  COMPREHENSIVE METABOLIC PANEL - Abnormal; Notable for the following components:      Result Value   Glucose, Bld 123 (*)    Calcium 8.6 (*)    AST 43 (*)    All other components within normal limits  SALICYLATE LEVEL - Abnormal; Notable for the following components:   Salicylate Lvl <7.0 (*)    All other components within normal limits  ACETAMINOPHEN LEVEL - Abnormal; Notable for the following components:   Acetaminophen (Tylenol), Serum <10 (*)    All other components within normal limits  ETHANOL - Abnormal; Notable for the following components:   Alcohol, Ethyl (B) 239 (*)    All other components within normal limits  CBC WITH DIFFERENTIAL/PLATELET - Abnormal; Notable for the following components:   WBC 14.6 (*)    HCT 38.4 (*)    Neutro Abs 10.8 (*)    All other components within normal limits  I-STAT CHEM 8, ED - Abnormal; Notable for the following components:   Glucose, Bld 120 (*)    Calcium, Ion 0.99 (*)    All other components within normal limits  RESP PANEL BY RT-PCR (RSV, FLU A&B, COVID)  RVPGX2  RAPID URINE DRUG SCREEN, HOSP PERFORMED  CBG MONITORING, ED    Clinical Course as of 10/09/22 1132  Sat Oct 09, 2022  0815 Evaluated and patient resting comfortably on stretcher.  [SB]  0902 Evaluated patient and patient awake on stretcher.  Discussed with patient that we will give him antibiotic to treat his sinusitis that was found on CT scan.  As well as IV fluids.  Patient agreeable at this time.  Offered patient sandwich, patient declines at this time.  Patient agreeable to graham crackers and apple juice at this time. [SB]  1125  Discussed with patient negative swab results.  Discussed with patient that his temperature improved in the emergency department with treatment regimen in the ED.  Discussed with patient to continue with over-the-counter antipyretics at home.  Again offered patient examined, patient declines at this time.  Answered all available questions.  Patient appears safe for discharge at this time. [SB]    Clinical Course User Index [SB] Barron Vanloan A, PA-C     Pt without abdominal pain, nausea, vomiting, chest pain, shortness of breath. Has had rhinorrhea, nasal congestion, cough. No sick contacts.  Discussed with patient that he has antibiotics at his pharmacy as per previous team to treat his sinusitis.  Thoroughly reviewed patient's lab and imaging studies at bedside. Supportive care and strict return precautions discussed with patient. Patient acknowledges and verbalizes understanding. Pt appears safe for discharge. Follow up as indicated in discharge paperwork.    This chart was dictated using voice recognition software, Dragon. Despite the best efforts of this provider to proofread and correct errors, errors may still occur which can change documentation meaning.   Anaily Ashbaugh A, PA-C 10/09/22 1133    Pricilla Loveless, MD 10/13/22 [phone]

## 2022-10-09 NOTE — ED Notes (Signed)
Patient ambulated from wheel chair to chair in waiting room after requesting time to "let his knee cool off" after application of knee sleeve by ortho tech. Patient ambulated independently with steady gait several steps between wheelchair and chair.

## 2022-10-09 NOTE — ED Triage Notes (Signed)
Patient here complaining of left knee pain after walking. Seen multiple times today for same.

## 2022-10-09 NOTE — ED Notes (Signed)
Pt back to room from CT at this time.
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
## 2022-10-09 NOTE — Discharge Instructions (Addendum)
Your CT showed concern for sinusitis, you will be sent a prescription for Augmentin (antibiotic), take as directed. Your swab was negative for COVID, Flu, RSV. You may take over the counter 500 mg tylenol every 6 hours and alternate with 600 mg ibuprofen every 6 hours. Ensure to maintain fluid intake. Return to the ED if you are experiencing increasing/worsening symptoms.

## 2022-10-09 NOTE — ED Notes (Signed)
Patient  called for triage, no answer  x1 

## 2022-10-09 NOTE — Discharge Instructions (Addendum)
Note that your visit to the emergency department today was overall reassuring.  As discussed, continue take Tylenol/Motrin as needed for knee pain.  Use knee brace as needed to help ambulate.Recommend following up with orthopedics at patient as we discussed; information for provider attached your discharge papers.  Please not hesitate to return to emergency department if the worrisome signs and symptoms we discussed become apparent.

## 2022-10-09 NOTE — ED Notes (Addendum)
Patient transported to CT by TRN ?
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
## 2022-10-11 ENCOUNTER — Other Ambulatory Visit: Payer: Self-pay

## 2022-10-11 ENCOUNTER — Emergency Department (HOSPITAL_BASED_OUTPATIENT_CLINIC_OR_DEPARTMENT_OTHER): Payer: Self-pay

## 2022-10-11 ENCOUNTER — Encounter (HOSPITAL_BASED_OUTPATIENT_CLINIC_OR_DEPARTMENT_OTHER): Payer: Self-pay

## 2022-10-11 ENCOUNTER — Emergency Department (HOSPITAL_BASED_OUTPATIENT_CLINIC_OR_DEPARTMENT_OTHER)
Admission: EM | Admit: 2022-10-11 | Discharge: 2022-10-11 | Disposition: A | Discharge status: Home / Self Care | Payer: Self-pay | Attending: Emergency Medicine | Admitting: Emergency Medicine

## 2022-10-11 DIAGNOSIS — Z5900 Homelessness unspecified: Secondary | ICD-10-CM | POA: Insufficient documentation

## 2022-10-11 DIAGNOSIS — M79671 Pain in right foot: Secondary | ICD-10-CM | POA: Insufficient documentation

## 2022-10-11 DIAGNOSIS — F17200 Nicotine dependence, unspecified, uncomplicated: Secondary | ICD-10-CM | POA: Insufficient documentation

## 2022-10-11 DIAGNOSIS — G8929 Other chronic pain: Secondary | ICD-10-CM | POA: Insufficient documentation

## 2022-10-11 MED ORDER — ACETAMINOPHEN 325 MG PO TABS
650.0000 mg | ORAL_TABLET | Freq: Once | ORAL | Status: AC
  Administered 2022-10-11: 650 mg via ORAL
  Filled 2022-10-11: qty 2
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
## 2022-10-11 NOTE — ED Triage Notes (Signed)
Pt brought in by [facility] for chronic right foot pain with worsening x 2 days. Denies injury. Pt states that he has had prior surgery to foot and ankle in 2021.
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
## 2022-10-12 ENCOUNTER — Emergency Department (HOSPITAL_BASED_OUTPATIENT_CLINIC_OR_DEPARTMENT_OTHER)
Admission: EM | Admit: 2022-10-12 | Discharge: 2022-10-12 | Disposition: A | Discharge status: Home / Self Care | Payer: Self-pay | Attending: Emergency Medicine | Admitting: Emergency Medicine

## 2022-10-12 ENCOUNTER — Other Ambulatory Visit: Payer: Self-pay

## 2022-10-12 ENCOUNTER — Encounter (HOSPITAL_BASED_OUTPATIENT_CLINIC_OR_DEPARTMENT_OTHER): Payer: Self-pay | Admitting: Emergency Medicine

## 2022-10-12 DIAGNOSIS — M79671 Pain in right foot: Secondary | ICD-10-CM | POA: Insufficient documentation

## 2022-10-12 MED ORDER — ACETAMINOPHEN 500 MG PO TABS
1000.0000 mg | ORAL_TABLET | Freq: Once | ORAL | Status: AC
  Administered 2022-10-12: 1000 mg via ORAL
  Filled 2022-10-12: qty 2
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
## 2022-10-12 NOTE — ED Notes (Addendum)
Registration staff states pt is in the lobby scaring other patients and visitors by cursing, staring at people, making faces at people, and being disruptive, EDP and security aware; EDP plans to see pt in triage

## 2022-10-12 NOTE — ED Provider Notes (Signed)
[facility] EMERGENCY DEPT Provider Note   CSN: [id number] Arrival date & time: 10/12/22  0111     History  Chief Complaint  Patient presents with   Foot Pain    Zachary Kelley is a 41 y.o. male.  The history is provided by the patient.  Foot Pain This is a chronic problem. The current episode started more than 1 week ago. The problem occurs constantly. The problem has not changed since onset.Pertinent negatives include no chest pain, no abdominal pain, no headaches and no shortness of breath. Nothing aggravates the symptoms. Nothing relieves the symptoms. He has tried nothing for the symptoms. The treatment provided no relief.  Seen last night and in the ED frequently for his chronic right foot pain.       Home Medications Prior to Admission medications   Medication Sig Start Date End Date Taking? Authorizing Provider  amoxicillin-clavulanate (AUGMENTIN) 875-125 MG tablet Take 1 tablet by mouth every 12 (twelve) hours. 10/09/22   Roxy Horseman, PA-C  diphenhydrAMINE (BENADRYL) 25 MG tablet Take 1 tablet (25 mg total) by mouth every 6 (six) hours as needed. 07/02/22   Mesner, Barbara Cower, MD  ibuprofen (ADVIL) 400 MG tablet Take 1 tablet (400 mg total) by mouth every 6 (six) hours as needed. 09/29/22   Fayrene Helper, PA-C  nystatin cream (MYCOSTATIN) Apply 1 Application topically 4 (four) times daily. Apply to affected area every 4-6 hours x 10 days 07/02/22   Mesner, Barbara Cower, MD      Allergies    Patient has no known allergies.    Review of Systems   Review of Systems  Constitutional:  Negative for fever.  HENT:  Negative for facial swelling.   Eyes:  Negative for redness.  Respiratory:  Negative for shortness of breath.   Cardiovascular:  Negative for chest pain.  Gastrointestinal:  Negative for abdominal pain.  Neurological:  Negative for headaches.  All other systems reviewed and are negative.   Physical Exam Updated Vital Signs Ht 6' (1.829 m)   Wt 79.8 kg    BMI 23.87 kg/m  Physical Exam Vitals and nursing note reviewed. Exam conducted with a chaperone present.  Constitutional:      General: He is not in acute distress.    Appearance: Normal appearance. He is well-developed. He is not diaphoretic.  HENT:     Head: Normocephalic and atraumatic.  Eyes:     Conjunctiva/sclera: Conjunctivae normal.     Pupils: Pupils are equal, round, and reactive to light.  Cardiovascular:     Rate and Rhythm: Normal rate and regular rhythm.  Pulmonary:     Effort: Pulmonary effort is normal.     Breath sounds: Normal breath sounds. No wheezing or rales.  Abdominal:     General: Bowel sounds are normal.     Palpations: Abdomen is soft.     Tenderness: There is no abdominal tenderness. There is no guarding or rebound.  Musculoskeletal:        General: Normal range of motion.     Cervical back: Normal range of motion and neck supple.     Right lower leg: Normal.     Right ankle: No swelling, deformity, ecchymosis or lacerations. Normal range of motion. Anterior drawer test negative. Normal pulse.     Right Achilles Tendon: Normal.     Right foot: Normal. Normal range of motion and normal capillary refill. No deformity, bunion, Charcot foot, foot drop, prominent metatarsal heads, laceration, tenderness, bony tenderness or crepitus. Normal  pulse.  Skin:    General: Skin is warm and dry.     Capillary Refill: Capillary refill takes less than 2 seconds.  Neurological:     General: No focal deficit present.     Mental Status: He is alert and oriented to person, place, and time.     Deep Tendon Reflexes: Reflexes normal.  Psychiatric:        Mood and Affect: Mood normal.        Behavior: Behavior normal.     ED Results / Procedures / Treatments   Labs (all labs ordered are listed, but only abnormal results are displayed) Labs Reviewed - No data to display  EKG None  Radiology DG Foot Complete Right  Result Date: 10/11/2022 CLINICAL DATA:   Chronic foot pain for 2 days, no known injury, initial encounter EXAM: RIGHT FOOT COMPLETE - 3+ VIEW COMPARISON:  None Available. FINDINGS: Postsurgical changes are noted in the distal fibula and tibia. No acute fracture or dislocation is noted. Mild hallux valgus deformity is seen. No soft tissue abnormality is seen. Mild degenerative changes of the tibiotalar joint are seen. IMPRESSION: Chronic changes without acute abnormality. Electronically Signed   By: Alcide Clever M.D.   On: 10/11/2022 02:53    Procedures Procedures    Medications Ordered in ED Medications  acetaminophen (TYLENOL) tablet 1,000 mg (has no administration in time range)    ED Course/ Medical Decision Making/ A&P                           Medical Decision Making Patient in the ED for chronic foot pain   Amount and/or Complexity of Data Reviewed External Data Reviewed: notes.    Details: Previous notes and imaging reviewed   Risk OTC drugs. Risk Details: Well appearing.  Exam is benign and reassuring.  No new trauma.  No indication for imaging.  Stable for discharge.      Final Clinical Impression(s) / ED Diagnoses Final diagnoses:  Right foot pain   Return for intractable cough, coughing up blood, fevers > 100.4 unrelieved by medication, shortness of breath, intractable vomiting, chest pain, shortness of breath, weakness, numbness, changes in speech, facial asymmetry, abdominal pain, passing out, Inability to tolerate liquids or food, cough, altered mental status or any concerns. No signs of systemic illness or infection. The patient is nontoxic-appearing on exam and vital signs are within normal limits.  I have reviewed the triage vital signs and the nursing notes. Pertinent labs & imaging results that were available during my care of the patient were reviewed by me and considered in my medical decision making (see chart for details). After history, exam, and medical workup I feel the patient has been  appropriately medically screened and is safe for discharge home. Pertinent diagnoses were discussed with the patient. Patient was given return precautions.    Rx / DC Orders ED Discharge Orders     None         Sarim Rothman, MD 10/12/22 7824

## 2022-10-12 NOTE — ED Triage Notes (Signed)
Pt c/o right foot pain

## 2022-10-13 ENCOUNTER — Encounter (HOSPITAL_COMMUNITY): Payer: Self-pay | Admitting: Emergency Medicine

## 2022-10-13 ENCOUNTER — Emergency Department (HOSPITAL_COMMUNITY)
Admission: EM | Admit: 2022-10-13 | Discharge: 2022-10-14 | Disposition: A | Discharge status: Home / Self Care | Payer: Self-pay | Attending: Student | Admitting: Student

## 2022-10-13 DIAGNOSIS — F10920 Alcohol use, unspecified with intoxication, uncomplicated: Secondary | ICD-10-CM | POA: Insufficient documentation

## 2022-10-13 DIAGNOSIS — S0990XA Unspecified injury of head, initial encounter: Secondary | ICD-10-CM | POA: Insufficient documentation

## 2022-10-13 DIAGNOSIS — S01511A Laceration without foreign body of lip, initial encounter: Secondary | ICD-10-CM | POA: Insufficient documentation

## 2022-10-13 DIAGNOSIS — Z5321 Procedure and treatment not carried out due to patient leaving prior to being seen by health care provider: Secondary | ICD-10-CM | POA: Insufficient documentation

## 2022-10-13 NOTE — ED Notes (Signed)
Pt is in room masturbating.
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
## 2022-10-14 ENCOUNTER — Other Ambulatory Visit: Payer: Self-pay

## 2022-10-14 ENCOUNTER — Emergency Department (HOSPITAL_COMMUNITY): Payer: Self-pay

## 2022-10-14 ENCOUNTER — Emergency Department (HOSPITAL_COMMUNITY)
Admission: EM | Admit: 2022-10-14 | Discharge: 2022-10-15 | Disposition: A | Discharge status: Home / Self Care | Payer: Self-pay | Attending: Emergency Medicine | Admitting: Emergency Medicine

## 2022-10-14 DIAGNOSIS — Z8659 Personal history of other mental and behavioral disorders: Secondary | ICD-10-CM | POA: Insufficient documentation

## 2022-10-14 DIAGNOSIS — S0181XA Laceration without foreign body of other part of head, initial encounter: Secondary | ICD-10-CM | POA: Insufficient documentation

## 2022-10-14 DIAGNOSIS — S0181XD Laceration without foreign body of other part of head, subsequent encounter: Secondary | ICD-10-CM

## 2022-10-14 DIAGNOSIS — Z87898 Personal history of other specified conditions: Secondary | ICD-10-CM

## 2022-10-14 DIAGNOSIS — S0083XA Contusion of other part of head, initial encounter: Secondary | ICD-10-CM
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
## 2022-10-14 NOTE — ED Notes (Signed)
Patient decided to leave.   

## 2022-10-15 ENCOUNTER — Other Ambulatory Visit: Payer: Self-pay

## 2022-10-15 ENCOUNTER — Emergency Department (HOSPITAL_COMMUNITY)
Admission: EM | Admit: 2022-10-15 | Discharge: 2022-10-16 | Disposition: A | Discharge status: Home / Self Care | Payer: Self-pay | Attending: Emergency Medicine | Admitting: Emergency Medicine

## 2022-10-15 DIAGNOSIS — M25571 Pain in right ankle and joints of right foot: Secondary | ICD-10-CM | POA: Insufficient documentation

## 2022-10-15 DIAGNOSIS — M79671 Pain in right foot: Secondary | ICD-10-CM

## 2022-10-15 MED ORDER — ACETAMINOPHEN 500 MG PO TABS
1000.0000 mg | ORAL_TABLET | Freq: Once | ORAL | Status: AC
  Administered 2022-10-15: 1000 mg via ORAL
  Filled 2022-10-15: qty 2
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
## 2022-10-15 NOTE — ED Notes (Signed)
The wound is cleansed, debrided of foreign material as much as possible, and dressed

## 2022-10-15 NOTE — ED Triage Notes (Signed)
Pt presents from swelling to R ankle at the site of a surgery 2 yrs ago that involved hardware.  Ambulatory with pain during weight bearing. Seen for same 10/12/22.
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
## 2022-10-15 NOTE — Discharge Instructions (Addendum)
It was our pleasure to provide your ER care today - we hope that you feel better.  Keep wound very clean - clean with soap and water 2x/day. Change dressing 2x/day, and as need.   Follow up closely with facial trauma specialist in the coming week - call office to arrange appointment.   Return to ER if worse, new symptoms, new/severe pain, fevers, infection of wound, or other concern.

## 2022-10-15 NOTE — ED Notes (Signed)
Discharge instructions discussed with pt. Verbalized understanding. VSS. No questions or concerns regarding discharge  

## 2022-10-16 MED ORDER — ACETAMINOPHEN 500 MG PO TABS
1000.0000 mg | ORAL_TABLET | ORAL | Status: AC
  Administered 2022-10-16: 1000 mg via ORAL
  Filled 2022-10-16: qty 2

## 2022-10-16 NOTE — Discharge Instructions (Addendum)
Take Tylenol 1000 mg every 6 hours for pain.

## 2022-10-16 NOTE — ED Notes (Signed)
No answer when called to see provider

## 2022-10-16 NOTE — ED Provider Notes (Signed)
[facility] EMERGENCY DEPARTMENT Provider Note   CSN: [id number] Arrival date & time: 10/15/22  1736     History  Chief Complaint  Patient presents with   Foot Pain    Zachary Kelley is a 41 y.o. male.   Foot Pain  Patient is a 41 year old male He is presenting to the emergency room with right foot/ankle pain he states has been ongoing for years since the surgery that he had.  He denies any injuries trauma and he denies any fevers chills swelling or any other new or concerning symptoms.      Home Medications Prior to Admission medications   Medication Sig Start Date End Date Taking? Authorizing Provider  amoxicillin-clavulanate (AUGMENTIN) 875-125 MG tablet Take 1 tablet by mouth every 12 (twelve) hours. 10/09/22   Roxy Horseman, PA-C  diphenhydrAMINE (BENADRYL) 25 MG tablet Take 1 tablet (25 mg total) by mouth every 6 (six) hours as needed. 07/02/22   Mesner, Barbara Cower, MD  ibuprofen (ADVIL) 400 MG tablet Take 1 tablet (400 mg total) by mouth every 6 (six) hours as needed. 09/29/22   Fayrene Helper, PA-C  nystatin cream (MYCOSTATIN) Apply 1 Application topically 4 (four) times daily. Apply to affected area every 4-6 hours x 10 days 07/02/22   Mesner, Barbara Cower, MD      Allergies    Patient has no known allergies.    Review of Systems   Review of Systems  Physical Exam Updated Vital Signs BP 129/76 (BP Location: Left Arm)   Pulse 72   Temp 97.8 F (36.6 C)   Resp 18   Wt 79.8 kg   SpO2 98%   BMI 23.87 kg/m  Physical Exam Vitals and nursing note reviewed.  Constitutional:      General: He is not in acute distress.    Appearance: Normal appearance. He is not ill-appearing.  HENT:     Head: Normocephalic and atraumatic.  Eyes:     General: No scleral icterus.       Right eye: No discharge.        Left eye: No discharge.     Conjunctiva/sclera: Conjunctivae normal.  Cardiovascular:     Comments: DP, PT pulses 3+ and symmetric Pulmonary:      Effort: Pulmonary effort is normal.     Breath sounds: No stridor.  Musculoskeletal:     Comments: Full range of motion of ankle and all toes.  Skin:    Comments: Skin without cellulitic changes no erythema or lesions abrasions lacerations over ulcers.  Neurological:     Mental Status: He is alert and oriented to person, place, and time. Mental status is at baseline.     ED Results / Procedures / Treatments   Labs (all labs ordered are listed, but only abnormal results are displayed) Labs Reviewed - No data to display  EKG None  Radiology No results found.  Procedures Procedures    Medications Ordered in ED Medications  acetaminophen (TYLENOL) tablet 1,000 mg (has no administration in time range)    ED Course/ Medical Decision Making/ A&P                           Medical Decision Making Risk OTC drugs.   Patient is a 41 year old male with no pertinent past medical history  He is present emergency room today with chronic right ankle and foot pain.  Last had an x-ray done 12/11 which was 5 days ago.  This was without any acute abnormal findings.  He has not had any trauma I have low suspicion for fracture or other bony injury.   IMPRESSION: Chronic changes without acute abnormality.     Electronically Signed   By: Alcide Clever M.D.   On: 10/11/2022 02:53   He is distally neurovascularly intact.  Vital signs within normal limits.  Will discharge home with follow-up with outpatient PCP.   Final Clinical Impression(s) / ED Diagnoses Final diagnoses:  None    Rx / DC Orders ED Discharge Orders     None         Gailen Shelter, [place] 10/16/22 0250    Palumbo, April, MD 10/16/22 [phone]

## 2022-10-17 ENCOUNTER — Encounter (HOSPITAL_COMMUNITY): Payer: Self-pay

## 2022-10-17 ENCOUNTER — Emergency Department (HOSPITAL_COMMUNITY)
Admission: EM | Admit: 2022-10-17 | Discharge: 2022-10-17 | Disposition: A | Discharge status: Home / Self Care | Payer: Self-pay | Attending: Emergency Medicine | Admitting: Emergency Medicine

## 2022-10-17 ENCOUNTER — Other Ambulatory Visit: Payer: Self-pay

## 2022-10-17 DIAGNOSIS — Z765 Malingerer [conscious simulation]: Secondary | ICD-10-CM | POA: Insufficient documentation

## 2022-10-17 DIAGNOSIS — G8929 Other chronic pain: Secondary | ICD-10-CM | POA: Insufficient documentation

## 2022-10-17 DIAGNOSIS — Z5900 Homelessness unspecified: Secondary | ICD-10-CM | POA: Insufficient documentation

## 2022-10-17 DIAGNOSIS — M79671 Pain in right foot: Secondary | ICD-10-CM | POA: Insufficient documentation

## 2022-10-17 MED ORDER — IBUPROFEN 800 MG PO TABS
800.0000 mg | ORAL_TABLET | Freq: Once | ORAL | Status: AC
  Administered 2022-10-17: 800 mg via ORAL
  Filled 2022-10-17: qty 1

## 2022-10-17 NOTE — ED Provider Notes (Signed)
[facility] EMERGENCY DEPARTMENT Provider Note   CSN: [id number] Arrival date & time: 10/17/22  0309     History  Chief Complaint  Patient presents with   Homeless    Zachary Kelley is a 41 y.o. male.  HPI Patient is a 41 year old male who is homeless and has a history of coming to the emergency room for shelter.  Initially was found that he had some foot pain which she is pending the past I have evaluated this patient yesterday.  After a very frank conversation about why he is here he was willing to tell me that he is in fact here because it is cold outside and he is homeless.      Home Medications Prior to Admission medications   Medication Sig Start Date End Date Taking? Authorizing Provider  amoxicillin-clavulanate (AUGMENTIN) 875-125 MG tablet Take 1 tablet by mouth every 12 (twelve) hours. 10/09/22   Roxy Horseman, PA-C  diphenhydrAMINE (BENADRYL) 25 MG tablet Take 1 tablet (25 mg total) by mouth every 6 (six) hours as needed. 07/02/22   Mesner, Barbara Cower, MD  ibuprofen (ADVIL) 400 MG tablet Take 1 tablet (400 mg total) by mouth every 6 (six) hours as needed. 09/29/22   Fayrene Helper, PA-C  nystatin cream (MYCOSTATIN) Apply 1 Application topically 4 (four) times daily. Apply to affected area every 4-6 hours x 10 days 07/02/22   Mesner, Barbara Cower, MD      Allergies    Patient has no known allergies.    Review of Systems   Review of Systems  Physical Exam Updated Vital Signs BP 125/70   Pulse 90   Temp 98.5 F (36.9 C)   Resp 18   SpO2 100%  Physical Exam Vitals and nursing note reviewed.  Constitutional:      General: He is not in acute distress.    Appearance: Normal appearance. He is not ill-appearing.  HENT:     Head: Normocephalic and atraumatic.  Eyes:     General: No scleral icterus.       Right eye: No discharge.        Left eye: No discharge.     Conjunctiva/sclera: Conjunctivae normal.  Pulmonary:     Effort: Pulmonary effort is  normal.     Breath sounds: No stridor.  Skin:    General: Skin is warm and dry.  Neurological:     Mental Status: He is alert and oriented to person, place, and time. Mental status is at baseline.     ED Results / Procedures / Treatments   Labs (all labs ordered are listed, but only abnormal results are displayed) Labs Reviewed - No data to display  EKG None  Radiology No results found.  Procedures Procedures    Medications Ordered in ED Medications - No data to display  ED Course/ Medical Decision Making/ A&P                           Medical Decision Making  Patient is a 41 year old male who is homeless and has a history of coming to the emergency room for shelter.  Initially was found that he had some foot pain which she is pending the past I have evaluated this patient yesterday.  After a very frank conversation about why he is here he was willing to tell me that he is in fact here because it is cold outside and he is homeless.   Physical exam was  somewhat perfunctory as a more detailed physical exam was done yesterday and patient has no complaints today other than being cold outside.  Will discharge to waiting room where he will wait until morning and then leave.  Copy of homeless shelter paperwork printed  Final Clinical Impression(s) / ED Diagnoses Final diagnoses:  Malingering    Rx / DC Orders ED Discharge Orders     None         Gailen Shelter, [place] 10/17/22 0408    Melene Plan, DO 10/17/22 2778

## 2022-10-17 NOTE — Discharge Instructions (Signed)
I have printed you a copy of the shelter list. Please use this for somewhere to stay.
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
## 2022-10-17 NOTE — Discharge Instructions (Signed)
Follow up outpatient.  I have given you resources for homeless shelters
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
## 2022-10-17 NOTE — ED Triage Notes (Signed)
Patient reports chronic right foot pain , no recent injury .

## 2022-10-29 ENCOUNTER — Emergency Department (HOSPITAL_COMMUNITY): Payer: Self-pay

## 2022-10-29 ENCOUNTER — Encounter (HOSPITAL_COMMUNITY): Payer: Self-pay | Admitting: Emergency Medicine

## 2022-10-29 ENCOUNTER — Emergency Department (HOSPITAL_COMMUNITY)
Admission: EM | Admit: 2022-10-29 | Discharge: 2022-10-29 | Disposition: A | Discharge status: Home / Self Care | Payer: Self-pay | Attending: Emergency Medicine | Admitting: Emergency Medicine

## 2022-10-29 DIAGNOSIS — G8929 Other chronic pain: Secondary | ICD-10-CM | POA: Insufficient documentation

## 2022-10-29 DIAGNOSIS — W0110XA Fall on same level from slipping, tripping and stumbling with subsequent striking against unspecified object, initial encounter: Secondary | ICD-10-CM | POA: Insufficient documentation

## 2022-10-29 DIAGNOSIS — M542 Cervicalgia: Secondary | ICD-10-CM | POA: Insufficient documentation

## 2022-10-29 DIAGNOSIS — M25571 Pain in right ankle and joints of right foot: Secondary | ICD-10-CM | POA: Insufficient documentation

## 2022-10-29 DIAGNOSIS — W19XXXA Unspecified fall, initial encounter: Secondary | ICD-10-CM

## 2022-10-29 MED ORDER — IBUPROFEN 400 MG PO TABS
600.0000 mg | ORAL_TABLET | Freq: Once | ORAL | Status: AC
  Administered 2022-10-29: 600 mg via ORAL
  Filled 2022-10-29: qty 1

## 2022-10-29 NOTE — ED Triage Notes (Signed)
[employer] called bc patient was unresponsive. PT has facial laceration that is healing. Complains of knee pain.

## 2022-10-29 NOTE — Discharge Instructions (Addendum)
If you develop new or worsening pain, numbness, inability to walk or bear weight, or any other new/concerning symptoms then return to the ER for evaluation
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
## 2022-10-29 NOTE — ED Provider Notes (Signed)
[place] EMERGENCY DEPARTMENT Provider Note   CSN: [id number] Arrival date & time: 10/29/22  0131     History  Chief Complaint  Patient presents with   Alcohol Intoxication    Zachary Kelley is a 41 y.o. male.  HPI 42 year old male presents with right knee and right ankle pain.  He states he fell last night.  When asked how he fell he states he is not sure though the triage note indicates that he was either intoxicated on alcohol or drugs.  He denies these.  Also endorses hitting the left side of his head and neck.  No new swelling, has chronic swelling in the right ankle that is unchanged.  Home Medications Prior to Admission medications   Medication Sig Start Date End Date Taking? Authorizing Provider  amoxicillin-clavulanate (AUGMENTIN) 875-125 MG tablet Take 1 tablet by mouth every 12 (twelve) hours. 10/09/22   Montine Circle, PA-C  diphenhydrAMINE (BENADRYL) 25 MG tablet Take 1 tablet (25 mg total) by mouth every 6 (six) hours as needed. 07/02/22   Mesner, Corene Cornea, MD  ibuprofen (ADVIL) 400 MG tablet Take 1 tablet (400 mg total) by mouth every 6 (six) hours as needed. 09/29/22   Domenic Moras, PA-C  nystatin cream (MYCOSTATIN) Apply 1 Application topically 4 (four) times daily. Apply to affected area every 4-6 hours x 10 days 07/02/22   Mesner, Corene Cornea, MD      Allergies    Patient has no known allergies.    Review of Systems   Review of Systems  Musculoskeletal:  Positive for arthralgias and neck pain.  Neurological:  Positive for headaches.    Physical Exam Updated Vital Signs BP 105/72 (BP Location: Left Arm)   Pulse 86   Temp 98.2 F (36.8 C)   Resp 20   SpO2 98%  Physical Exam Vitals and nursing note reviewed.  Constitutional:      Appearance: He is well-developed.     Comments: Not intoxicated  HENT:     Head: Normocephalic and atraumatic.     Comments: No obvious head injury Neck:   Cardiovascular:     Rate and Rhythm: Normal rate and  regular rhythm.     Pulses:          Dorsalis pedis pulses are 2+ on the right side.  Pulmonary:     Effort: Pulmonary effort is normal.  Abdominal:     General: There is no distension.  Musculoskeletal:     Cervical back: Normal range of motion. No rigidity. Muscular tenderness present. No spinous process tenderness.     Right knee: Normal range of motion. No tenderness.     Right ankle: Swelling present. No tenderness. Normal range of motion.     Right foot: No tenderness.       Legs:  Skin:    General: Skin is warm and dry.  Neurological:     Mental Status: He is alert.     ED Results / Procedures / Treatments   Labs (all labs ordered are listed, but only abnormal results are displayed) Labs Reviewed - No data to display  EKG None  Radiology DG Ankle Complete Right  Result Date: 10/29/2022 CLINICAL DATA:  Right knee and ankle pain. EXAM: RIGHT ANKLE - COMPLETE 3+ VIEW COMPARISON:  Right tibia/fibula radiographs 09/29/2022 and right ankle radiographs 07/14/2022 FINDINGS: Lateral plate and screw fixation are again noted of the distal fibula with screws traversing the syndesmosis and terminating in the distal tibial metadiaphysis. Lucency surrounding  both screws is unchanged and suggestive of chronic loosening. No acute fracture or dislocation is identified. Degenerative changes are again noted at the ankle, including prominent tibiotalar osteophytosis. Chronic ossicles are noted at the medial malleolus. IMPRESSION: 1. No acute osseous abnormality identified. 2. Postsurgical, posttraumatic, and degenerative changes about the ankle as above. Electronically Signed   By: Logan Bores M.D.   On: 10/29/2022 08:38   DG Knee Complete 4 Views Right  Result Date: 10/29/2022 CLINICAL DATA:  Right knee and ankle pain. EXAM: RIGHT KNEE - COMPLETE 4+ VIEW COMPARISON:  Right tibia/fibula radiographs 09/29/2022 and right knee radiographs 07/14/2022 FINDINGS: No acute fracture, dislocation, or  knee joint effusion is identified. A chronic deformity is again noted of the proximal fibula. Mild osteoarthrosis is unchanged and again most notable in the lateral compartment where there is mild joint space narrowing and marginal osteophytosis. The soft tissues are unremarkable. IMPRESSION: No acute osseous abnormality identified. Unchanged mild osteoarthrosis. Electronically Signed   By: Logan Bores M.D.   On: 10/29/2022 08:33   CT Head Wo Contrast  Result Date: 10/29/2022 CLINICAL DATA:  Possible drug overdose. EXAM: CT HEAD WITHOUT CONTRAST CT CERVICAL SPINE WITHOUT CONTRAST TECHNIQUE: Multidetector CT imaging of the head and cervical spine was performed following the standard protocol without intravenous contrast. Multiplanar CT image reconstructions of the cervical spine were also generated. RADIATION DOSE REDUCTION: This exam was performed according to the departmental dose-optimization program which includes automated exposure control, adjustment of the mA and/or kV according to patient size and/or use of iterative reconstruction technique. COMPARISON:  10/14/2022 FINDINGS: CT HEAD FINDINGS Brain: No mass, hemorrhage or extra-axial collection. Brain parenchyma is normal. Normal CSF spaces. Vascular: No hyperdense vessel or unexpected calcification. Skull: Normal. Negative for fracture or focal lesion. Sinuses/Orbits: No acute finding. Other: None CT CERVICAL SPINE FINDINGS Alignment: The head is rotated to the right. There is reversal of normal cervical lordosis. No static subluxation. Skull base and vertebrae: No acute fracture. Soft tissues and spinal canal: No prevertebral fluid or swelling. No visible canal hematoma. Disc levels:  No bony spinal canal stenosis. Upper chest: Negative. Other: None IMPRESSION: 1. No acute intracranial abnormality. 2. No acute fracture or static subluxation of the cervical spine. Electronically Signed   By: Ulyses Jarred M.D.   On: 10/29/2022 02:26   CT Cervical  Spine Wo Contrast  Result Date: 10/29/2022 CLINICAL DATA:  Possible drug overdose. EXAM: CT HEAD WITHOUT CONTRAST CT CERVICAL SPINE WITHOUT CONTRAST TECHNIQUE: Multidetector CT imaging of the head and cervical spine was performed following the standard protocol without intravenous contrast. Multiplanar CT image reconstructions of the cervical spine were also generated. RADIATION DOSE REDUCTION: This exam was performed according to the departmental dose-optimization program which includes automated exposure control, adjustment of the mA and/or kV according to patient size and/or use of iterative reconstruction technique. COMPARISON:  10/14/2022 FINDINGS: CT HEAD FINDINGS Brain: No mass, hemorrhage or extra-axial collection. Brain parenchyma is normal. Normal CSF spaces. Vascular: No hyperdense vessel or unexpected calcification. Skull: Normal. Negative for fracture or focal lesion. Sinuses/Orbits: No acute finding. Other: None CT CERVICAL SPINE FINDINGS Alignment: The head is rotated to the right. There is reversal of normal cervical lordosis. No static subluxation. Skull base and vertebrae: No acute fracture. Soft tissues and spinal canal: No prevertebral fluid or swelling. No visible canal hematoma. Disc levels:  No bony spinal canal stenosis. Upper chest: Negative. Other: None IMPRESSION: 1. No acute intracranial abnormality. 2. No acute fracture or static subluxation  of the cervical spine. Electronically Signed   By: Deatra Robinson M.D.   On: 10/29/2022 02:26    Procedures Procedures    Medications Ordered in ED Medications  ibuprofen (ADVIL) tablet 600 mg (has no administration in time range)    ED Course/ Medical Decision Making/ A&P                           Medical Decision Making  Unclear how exactly patient fell but based on triage note was probably related to drug/alcohol.  However I'm seeing him 7 hours after presentation and so he's not currently intoxicated. Here he has normal vitals.   His x-rays and CTs are unremarkable.  Neurovascular intact.  I suspect the ankle in particular and probably the knee are both chronic pain more than new pain.  Will give ibuprofen and have him follow-up with PCP.        Final Clinical Impression(s) / ED Diagnoses Final diagnoses:  Fall, initial encounter  Chronic pain of right ankle    Rx / DC Orders ED Discharge Orders     None         Pricilla Loveless, MD 10/29/22 [phone]

## 2022-11-03 ENCOUNTER — Other Ambulatory Visit: Payer: Self-pay

## 2022-11-03 ENCOUNTER — Encounter (HOSPITAL_COMMUNITY): Payer: Self-pay

## 2022-11-03 ENCOUNTER — Emergency Department (HOSPITAL_COMMUNITY)
Admission: EM | Admit: 2022-11-03 | Discharge: 2022-11-03 | Discharge status: Against Medical Advice | Payer: Self-pay | Attending: Emergency Medicine | Admitting: Emergency Medicine

## 2022-11-03 DIAGNOSIS — K0889 Other specified disorders of teeth and supporting structures: Secondary | ICD-10-CM | POA: Insufficient documentation

## 2022-11-03 DIAGNOSIS — Z5321 Procedure and treatment not carried out due to patient leaving prior to being seen by health care provider: Secondary | ICD-10-CM | POA: Insufficient documentation

## 2022-11-03 DIAGNOSIS — R21 Rash and other nonspecific skin eruption: Secondary | ICD-10-CM | POA: Insufficient documentation
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
## 2022-11-03 NOTE — ED Notes (Signed)
Called for vitals. No reponse

## 2022-11-04 ENCOUNTER — Other Ambulatory Visit: Payer: Self-pay

## 2022-11-04 ENCOUNTER — Emergency Department (HOSPITAL_COMMUNITY): Payer: Self-pay

## 2022-11-04 ENCOUNTER — Emergency Department (HOSPITAL_COMMUNITY)
Admission: EM | Admit: 2022-11-04 | Discharge: 2022-11-04 | Disposition: A | Discharge status: Home / Self Care | Payer: Self-pay | Attending: Emergency Medicine | Admitting: Emergency Medicine

## 2022-11-04 DIAGNOSIS — L0889 Other specified local infections of the skin and subcutaneous tissue: Secondary | ICD-10-CM | POA: Insufficient documentation

## 2022-11-04 DIAGNOSIS — L089 Local infection of the skin and subcutaneous tissue, unspecified: Secondary | ICD-10-CM

## 2022-11-04 DIAGNOSIS — L0291 Cutaneous abscess, unspecified: Secondary | ICD-10-CM

## 2022-11-04 DIAGNOSIS — H04319 Phlegmonous dacryocystitis of unspecified lacrimal passage: Secondary | ICD-10-CM | POA: Insufficient documentation

## 2022-11-04 LAB — I-STAT CHEM 8, ED
BUN: 11 mg/dL (ref 6–20)
Calcium, Ion: 1.13 mmol/L — ABNORMAL LOW (ref 1.15–1.40)
Chloride: 106 mmol/L (ref 98–111)
Creatinine, Ser: 0.7 mg/dL (ref 0.61–1.24)
Glucose, Bld: 80 mg/dL (ref 70–99)
HCT: 38 % — ABNORMAL LOW (ref 39.0–52.0)
Hemoglobin: 12.9 g/dL — ABNORMAL LOW (ref 13.0–17.0)
Potassium: 4.2 mmol/L (ref 3.5–5.1)
Sodium: 143 mmol/L (ref 135–145)
TCO2: 25 mmol/L (ref 22–32)

## 2022-11-04 MED ORDER — AMOXICILLIN-POT CLAVULANATE 875-125 MG PO TABS: 1.0000 | ORAL_TABLET | Freq: Two times a day (BID) | ORAL | 0 refills | Status: AC

## 2022-11-04 MED ORDER — ACETAMINOPHEN 325 MG PO TABS
650.0000 mg | ORAL_TABLET | Freq: Once | ORAL | Status: AC
  Administered 2022-11-04: 650 mg via ORAL
  Filled 2022-11-04: qty 2

## 2022-11-04 MED ORDER — IOHEXOL 350 MG/ML SOLN
75.0000 mL | Freq: Once | INTRAVENOUS | Status: AC | PRN
  Administered 2022-11-04: 75 mL via INTRAVENOUS

## 2022-11-04 NOTE — ED Triage Notes (Signed)
Pt returned for evaluation for ongoing dental pain.

## 2022-11-04 NOTE — Discharge Instructions (Addendum)
There is concern for possible infection in your face, please make sure that you take the antibiotic as prescribed, and return to the ER if you have fever, chills, worsening swelling of your face, confusion or dizziness.

## 2022-11-04 NOTE — ED Notes (Signed)
Patient did not respond for room x3 

## 2022-11-04 NOTE — ED Notes (Signed)
Called for triage no answer  

## 2022-11-04 NOTE — ED Notes (Signed)
Patient verbalized understanding of discharge instructions and reasons to return to the ED 

## 2022-11-04 NOTE — ED Provider Triage Note (Signed)
Emergency Medicine Provider Triage Evaluation Note  Zachary Kelley , a 42 y.o. male  was evaluated in triage.  Pt complains of facial swelling to L upper lip. Reports no recent dentist. No fever, chills  Review of Systems  Positive: Facial swelling Negative: fever  Physical Exam  BP (!) 124/100   Pulse 77   Temp 97.7 F (36.5 C) (Oral)   Resp 16   SpO2 98%  Gen:   Awake, no distress   Resp:  Normal effort  MSK:   Moves extremities without difficulty  Other:  +indurated area to upper lip/maxilla  Medical Decision Making  Medically screening exam initiated at 12:45 PM.  Appropriate orders placed.  Zachary Kelley was informed that the remainder of the evaluation will be completed by another provider, this initial triage assessment does not replace that evaluation, and the importance of remaining in the ED until their evaluation is complete.     Osvaldo Shipper, [place] 11/04/22 1246

## 2022-11-04 NOTE — ED Provider Notes (Signed)
[facility] EMERGENCY DEPARTMENT Provider Note   CSN: [id number] Arrival date & time: 11/04/22  0550     History  Chief Complaint  Patient presents with   Dental Pain    Zachary Kelley is a 42 y.o. male, here for L upper lip painx3 days. States he has a cut there from falling and hitting his face and it has become more swollen and painful. He denies any fever or chills. States he has not seen in a dentist in many years.      Home Medications Prior to Admission medications   Medication Sig Start Date End Date Taking? Authorizing Provider  amoxicillin-clavulanate (AUGMENTIN) 875-125 MG tablet Take 1 tablet by mouth every 12 (twelve) hours. 11/04/22  Yes Burdell Peed L, PA  diphenhydrAMINE (BENADRYL) 25 MG tablet Take 1 tablet (25 mg total) by mouth every 6 (six) hours as needed. 07/02/22   Mesner, Corene Cornea, MD  ibuprofen (ADVIL) 400 MG tablet Take 1 tablet (400 mg total) by mouth every 6 (six) hours as needed. 09/29/22   Domenic Moras, PA-C  nystatin cream (MYCOSTATIN) Apply 1 Application topically 4 (four) times daily. Apply to affected area every 4-6 hours x 10 days 07/02/22   Mesner, Corene Cornea, MD      Allergies    Patient has no known allergies.    Review of Systems   Review of Systems  Constitutional:  Negative for fever.  HENT:  Positive for dental problem.     Physical Exam Updated Vital Signs BP 133/85 (BP Location: Left Arm)   Pulse 79   Temp 99.5 F (37.5 C) (Oral)   Resp 18   SpO2 99%  Physical Exam Vitals and nursing note reviewed.  Constitutional:      General: He is not in acute distress.    Appearance: He is well-developed.  HENT:     Head: Normocephalic and atraumatic.     Mouth/Throat:     Mouth: Mucous membranes are moist.     Pharynx: No posterior oropharyngeal erythema.     Comments: Many dental caries, induration to area superior to L upper lip. Mild erythema. TTP and swelling to area, w/ overlying healing laceration.  Eyes:      Conjunctiva/sclera: Conjunctivae normal.  Cardiovascular:     Rate and Rhythm: Normal rate and regular rhythm.     Heart sounds: No murmur heard. Pulmonary:     Effort: Pulmonary effort is normal. No respiratory distress.     Breath sounds: Normal breath sounds.  Abdominal:     Palpations: Abdomen is soft.     Tenderness: There is no abdominal tenderness.  Musculoskeletal:        General: No swelling.     Cervical back: Neck supple.  Skin:    General: Skin is warm and dry.     Capillary Refill: Capillary refill takes less than 2 seconds.  Neurological:     Mental Status: He is alert.  Psychiatric:        Mood and Affect: Mood normal.     ED Results / Procedures / Treatments   Labs (all labs ordered are listed, but only abnormal results are displayed) Labs Reviewed  I-STAT CHEM 8, ED - Abnormal; Notable for the following components:      Result Value   Calcium, Ion 1.13 (*)    Hemoglobin 12.9 (*)    HCT 38.0 (*)    All other components within normal limits    EKG None  Radiology CT Maxillofacial  W Contrast  Result Date: 11/04/2022 CLINICAL DATA:  abscess of face/mouth soft tissue EXAM: CT MAXILLOFACIAL WITH CONTRAST TECHNIQUE: Multidetector CT imaging of the maxillofacial structures was performed with intravenous contrast. Multiplanar CT image reconstructions were also generated. RADIATION DOSE REDUCTION: This exam was performed according to the departmental dose-optimization program which includes automated exposure control, adjustment of the mA and/or kV according to patient size and/or use of iterative reconstruction technique. CONTRAST:  68mL OMNIPAQUE IOHEXOL 350 MG/ML SOLN COMPARISON:  None Available. FINDINGS: Osseous: No fracture or mandibular dislocation. Orbits: Negative. No traumatic or inflammatory finding. Sinuses: Clear. Soft tissues: Soft tissue thickening along the left paramidline lip, anterior to the maxilla, nonspecific but potentially phlegmon and/or sequela  of recent trauma. No discrete, drainable fluid collection. Punctate hyperdensity in this region is similar (series 4, image 41 Limited intracranial: No significant or unexpected finding. IMPRESSION: Soft tissue thickening along the left paramidline lip, anterior to the maxilla, nonspecific but potentially phlegmon and/or sequela of recent trauma. No discrete, drainable fluid collection. Punctate hyperdensity in this region is similar to 10/14/2022 prior and could represent a tiny foreign body versus mineralization. Electronically Signed   By: Margaretha Sheffield M.D.   On: 11/04/2022 14:04    Procedures Procedures    Medications Ordered in ED Medications  acetaminophen (TYLENOL) tablet 650 mg (650 mg Oral Given 11/04/22 1350)  iohexol (OMNIPAQUE) 350 MG/ML injection 75 mL (75 mLs Intravenous Contrast Given 11/04/22 1341)    ED Course/ Medical Decision Making/ A&P                           Medical Decision Making Patient is a 42 year old male, here for swelling of his left upper lip, been going on for the last 3 days.  States he has a cat there, and the area has gotten more swollen.  Notes that he has gone to the dentist in a while.  There is an area indurated area, with mild erythema overlying it, we will obtain a CT max face to evaluate for possible abscess, and an i-STAT to check on his creatinine prior to the CT.  Tylenol for pain.  Amount and/or Complexity of Data Reviewed Radiology: ordered.    Details: CT shows possible early phlegmon, versus inflammation secondary to trauma. Discussion of management or test interpretation with external provider(s): Patient does have overlying her edema, mildly, we will treat with Augmentin, and have him follow-up with his primary care doctor.  Discussed return precautions, he voiced understanding.  Risk OTC drugs. Prescription drug management.     Final Clinical Impression(s) / ED Diagnoses Final diagnoses:  Phlegmon  Facial infection    Rx / DC  Orders ED Discharge Orders          Ordered    amoxicillin-clavulanate (AUGMENTIN) 875-125 MG tablet  Every 12 hours        11/04/22 1419              Krissia Schreier, [place], [place] 11/04/22 1423    Ezequiel Essex, MD 11/04/22 1513

## 2022-11-05 ENCOUNTER — Other Ambulatory Visit: Payer: Self-pay

## 2022-11-05 ENCOUNTER — Emergency Department (HOSPITAL_COMMUNITY)
Admission: EM | Admit: 2022-11-05 | Discharge: 2022-11-05 | Disposition: A | Discharge status: Home / Self Care | Payer: Self-pay | Attending: Emergency Medicine | Admitting: Emergency Medicine

## 2022-11-05 DIAGNOSIS — M25562 Pain in left knee: Secondary | ICD-10-CM | POA: Insufficient documentation

## 2022-11-05 DIAGNOSIS — Z5321 Procedure and treatment not carried out due to patient leaving prior to being seen by health care provider: Secondary | ICD-10-CM | POA: Insufficient documentation

## 2022-11-05 NOTE — ED Triage Notes (Signed)
Patient reports left knee pain , denies recent injury ,ambulatory .

## 2022-11-05 NOTE — ED Provider Triage Note (Signed)
Emergency Medicine Provider Triage Evaluation Note  Jessup Ogas , a 42 y.o. male  was evaluated in triage.  Pt complains of left knee pain.  Seen here several times in the past for same.  Ambulatory in triage.  Review of Systems  Positive: Knee pain Negative: fever  Physical Exam  BP (!) 144/95 (BP Location: Right Arm)   Pulse 88   Temp 97.7 F (36.5 C) (Oral)   Resp 20   SpO2 100%   Gen:   Awake, no distress   Resp:  Normal effort  MSK:   Moves extremities without difficulty  Other:  Ambulatory, not really wanting to stay in exam room, not answering many questions  Medical Decision Making  Medically screening exam initiated at 4:29 AM.  Appropriate orders placed.  Son Booher was informed that the remainder of the evaluation will be completed by another provider, this initial triage assessment does not replace that evaluation, and the importance of remaining in the ED until their evaluation is complete.  Left knee pain.  Ambulatory in triage.   Larene Pickett, PA-C 11/05/22 [phone]

## 2022-11-09 ENCOUNTER — Other Ambulatory Visit: Payer: Self-pay

## 2022-11-09 ENCOUNTER — Emergency Department (HOSPITAL_COMMUNITY)
Admission: EM | Admit: 2022-11-09 | Discharge: 2022-11-09 | Disposition: A | Discharge status: Home / Self Care | Payer: Self-pay | Attending: Emergency Medicine | Admitting: Emergency Medicine

## 2022-11-09 DIAGNOSIS — M25562 Pain in left knee: Secondary | ICD-10-CM | POA: Insufficient documentation
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
## 2022-11-09 NOTE — ED Triage Notes (Addendum)
Patient reports left knee pain for several days , no injury/ambulatory.

## 2022-11-13 ENCOUNTER — Emergency Department (HOSPITAL_COMMUNITY): Payer: Self-pay

## 2022-11-13 ENCOUNTER — Other Ambulatory Visit: Payer: Self-pay

## 2022-11-13 ENCOUNTER — Emergency Department (HOSPITAL_COMMUNITY)
Admission: EM | Admit: 2022-11-13 | Discharge: 2022-11-13 | Disposition: A | Discharge status: Home / Self Care | Payer: Self-pay | Attending: Emergency Medicine | Admitting: Emergency Medicine

## 2022-11-13 ENCOUNTER — Encounter (HOSPITAL_COMMUNITY): Payer: Self-pay | Admitting: *Deleted

## 2022-11-13 DIAGNOSIS — M25562 Pain in left knee: Secondary | ICD-10-CM

## 2022-11-13 DIAGNOSIS — S8982XA Other specified injuries of left lower leg, initial encounter: Secondary | ICD-10-CM | POA: Insufficient documentation

## 2022-11-13 DIAGNOSIS — S8990XA Unspecified injury of unspecified lower leg, initial encounter: Secondary | ICD-10-CM

## 2022-11-13 MED ORDER — LIDOCAINE 5 % EX PTCH
1.0000 | MEDICATED_PATCH | CUTANEOUS | Status: DC
  Administered 2022-11-13: 1 via TRANSDERMAL
  Filled 2022-11-13: qty 1

## 2022-11-13 MED ORDER — ACETAMINOPHEN 500 MG PO TABS
1000.0000 mg | ORAL_TABLET | ORAL | Status: AC
  Administered 2022-11-13: 1000 mg via ORAL
  Filled 2022-11-13: qty 2

## 2022-11-13 MED ORDER — LIDOCAINE 5 % EX PTCH: 1.0000 | MEDICATED_PATCH | CUTANEOUS | 0 refills | Status: AC

## 2022-11-13 NOTE — ED Provider Triage Note (Signed)
  Emergency Medicine Provider Triage Evaluation Note  MRN:  [id number]  Arrival date & time: 11/13/22    Medically screening exam initiated at 5:26 AM.   CC:   Chronic pain  HPI:  Zachary Kelley is a 42 y.o. year-old male presents to the ED with chief complaint of left knee pain.  Seen recently for the same.    History provided by patient. ROS:  -As included in HPI PE:   Vitals:   11/13/22 0525  BP: 136/87  Pulse: 90  Resp: 17  Temp: 97.6 F (36.4 C)  SpO2: 97%    Non-toxic appearing No respiratory distress  MDM:  Based on signs and symptoms, malingering is highest on my differential.   Patient was informed that the remainder of the evaluation will be completed by another provider, this initial triage assessment does not replace that evaluation, and the importance of remaining in the ED until their evaluation is complete.    Montine Circle, PA-C 11/13/22 [phone]
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
## 2022-11-13 NOTE — ED Notes (Signed)
Patient reports he can't walk due to knee pain but could stand and ambulate to wheelchair with no problem.

## 2022-11-13 NOTE — Discharge Instructions (Addendum)
You were seen for your knee pain in the emergency department.  It appears that you may have had an MCL (medial collateral ligament) injury of your knee at a prior date.  At home, please use an Ace wrap and Tylenol and ibuprofen over-the-counter for any pain that you have.  You may also use lidocaine patches we have prescribed you.    Follow-up with your primary doctor in 2-3 days regarding your visit.  Follow-up with sports medicine as soon as possible regarding your knee pain.  Thank you for visiting our Emergency Department. It was a pleasure taking care of you today.
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
## 2022-11-14 ENCOUNTER — Emergency Department (HOSPITAL_COMMUNITY)
Admission: EM | Admit: 2022-11-14 | Discharge: 2022-11-15 | Discharge status: Against Medical Advice | Payer: Self-pay | Attending: Emergency Medicine | Admitting: Emergency Medicine

## 2022-11-14 DIAGNOSIS — Z5321 Procedure and treatment not carried out due to patient leaving prior to being seen by health care provider: Secondary | ICD-10-CM | POA: Insufficient documentation

## 2022-11-14 DIAGNOSIS — M25562 Pain in left knee: Secondary | ICD-10-CM | POA: Insufficient documentation

## 2022-11-14 NOTE — ED Notes (Signed)
During triage pt stated "I am just gonna sit at the front." Pt walked out of triage room.

## 2022-11-14 NOTE — ED Triage Notes (Signed)
Pt arrives with c/o left knee pain. Pt denies injury.

## 2022-11-20 ENCOUNTER — Emergency Department (HOSPITAL_COMMUNITY): Payer: Self-pay

## 2022-11-20 ENCOUNTER — Other Ambulatory Visit: Payer: Self-pay

## 2022-11-20 ENCOUNTER — Emergency Department (HOSPITAL_COMMUNITY)
Admission: EM | Admit: 2022-11-20 | Discharge: 2022-11-20 | Disposition: A | Discharge status: Home / Self Care | Payer: Self-pay | Attending: Emergency Medicine | Admitting: Emergency Medicine

## 2022-11-20 DIAGNOSIS — G8929 Other chronic pain: Secondary | ICD-10-CM | POA: Insufficient documentation

## 2022-11-20 DIAGNOSIS — W228XXA Striking against or struck by other objects, initial encounter: Secondary | ICD-10-CM | POA: Insufficient documentation

## 2022-11-20 DIAGNOSIS — S8992XA Unspecified injury of left lower leg, initial encounter: Secondary | ICD-10-CM | POA: Insufficient documentation

## 2022-11-20 DIAGNOSIS — S8990XA Unspecified injury of unspecified lower leg, initial encounter: Secondary | ICD-10-CM

## 2022-11-20 NOTE — ED Provider Triage Note (Signed)
Emergency Medicine Provider Triage Evaluation Note  Jarell Mcewen , a 42 y.o. male  was evaluated in triage.  Pt complains of chronic left knee pain.  After lengthy discussion I did offer x-ray which he would like.  Intended to discharge after x-ray.  Review of Systems  Positive: Left knee pain chronic Negative: Fever  Physical Exam  BP (!) 127/91   Pulse 83   Temp (!) 97.5 F (36.4 C)   Resp 18   SpO2 100%  Gen:   Awake, no distress  Resp:  Normal effort  MSK:   Moves extremities without difficulty  Other:    Medical Decision Making  Medically screening exam initiated at 5:57 AM.  Appropriate orders placed.  Ivor Oyervides was informed that the remainder of the evaluation will be completed by another provider, this initial triage assessment does not replace that evaluation, and the importance of remaining in the ED until their evaluation is complete.  Left knee pain.  No new trauma.  Prior x-rays of chronic changes.   Pati Gallo [place], [place] 11/20/22 [phone]
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
## 2022-11-20 NOTE — Discharge Instructions (Addendum)
Please use Tylenol or ibuprofen for pain.  You may use 600-800 mg ibuprofen every 6 hours or 1000 mg of Tylenol every 6 hours.  You may choose to alternate between the 2.  This would be most effective.  Not to exceed 4 g of Tylenol within 24 hours.  Not to exceed 3200 mg ibuprofen 24 hours.  You can apply some ice or heat directly to the area where it hurts.   If you have worsening pain or new injury, please return to the emergency department.  I have provided the names of 2 primary care clinics. I recommend you arrange a follow up appointment with one of these clinics in the next week to establish a primary care provider and for management of your chronic knee pain and other conditions. If your pain persists, I recommend you follow up with orthopedics to discuss their recommendations regarding your chronic knee pain and chronic MCL injury.

## 2022-11-20 NOTE — ED Notes (Signed)
Pt verbalized understanding of alternation tylenol and ibuprofen for pain.  Pt also educated on ice and heat application.  Pt verbalized understanding with no questions at the time.  This RN highlighted follow up in a week with two clinics provided by provider at end of discharge paperwork.  Pt aware.  No new orders.

## 2022-11-20 NOTE — ED Triage Notes (Signed)
Patient reports left knee pain for several days , denies injury.

## 2022-11-24 ENCOUNTER — Other Ambulatory Visit: Payer: Self-pay

## 2022-11-24 ENCOUNTER — Emergency Department (HOSPITAL_COMMUNITY)
Admission: EM | Admit: 2022-11-24 | Discharge: 2022-11-24 | Disposition: A | Discharge status: Home / Self Care | Payer: Self-pay | Attending: Emergency Medicine | Admitting: Emergency Medicine

## 2022-11-24 DIAGNOSIS — M25562 Pain in left knee: Secondary | ICD-10-CM | POA: Insufficient documentation

## 2022-11-24 DIAGNOSIS — G8929 Other chronic pain: Secondary | ICD-10-CM | POA: Insufficient documentation

## 2022-11-24 MED ORDER — IBUPROFEN 400 MG PO TABS: 400.0000 mg | ORAL_TABLET | Freq: Four times a day (QID) | ORAL | 0 refills | Status: DC | PRN

## 2022-11-24 MED ORDER — IBUPROFEN 400 MG PO TABS
600.0000 mg | ORAL_TABLET | Freq: Once | ORAL | Status: AC
  Administered 2022-11-24: 600 mg via ORAL
  Filled 2022-11-24: qty 1

## 2022-11-24 NOTE — Progress Notes (Signed)
Orthopedic Tech Progress Note Patient Details:  Zachary Kelley 1981/06/03 [id number]  Ortho Devices Type of Ortho Device: Knee Sleeve Ortho Device/Splint Location: Left knee Ortho Device/Splint Interventions: Application   Post Interventions Patient Tolerated: Well  Zachary Kelley 11/24/2022, 10:08 AM

## 2022-11-24 NOTE — ED Notes (Signed)
DC instructions reviewed with pt. PT verbalized understanding. Pt DC °

## 2022-11-24 NOTE — Discharge Instructions (Signed)
You were seen in the emergency department today for left knee pain.  We provided you with a knee brace and some ibuprofen.  I have sent a prescription for ibuprofen to the [facility] on [address] this that you can take every 6 hours for pain.  Please return if you have a new trauma to the knee or are unable to walk on the leg.
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
## 2022-11-24 NOTE — ED Triage Notes (Signed)
Pt arrives with c/o left knee pain that has been going on for months. Pt ambulatory in triage.

## 2022-11-24 NOTE — ED Notes (Addendum)
PT sitting by vending machine . PT was called multiple times and ignored NT . PT will not come back to get triaged .

## 2022-11-24 NOTE — ED Notes (Signed)
Pt was called 3x and refused to come back to triage

## 2022-12-01 ENCOUNTER — Other Ambulatory Visit: Payer: Self-pay

## 2022-12-01 ENCOUNTER — Emergency Department (HOSPITAL_COMMUNITY)
Admission: EM | Admit: 2022-12-01 | Discharge: 2022-12-01 | Disposition: A | Discharge status: Home / Self Care | Payer: Self-pay | Attending: Emergency Medicine | Admitting: Emergency Medicine

## 2022-12-01 DIAGNOSIS — G8929 Other chronic pain: Secondary | ICD-10-CM | POA: Insufficient documentation

## 2022-12-01 DIAGNOSIS — M25562 Pain in left knee: Secondary | ICD-10-CM | POA: Insufficient documentation

## 2022-12-01 MED ORDER — ACETAMINOPHEN 500 MG PO TABS
1000.0000 mg | ORAL_TABLET | Freq: Once | ORAL | Status: AC
  Administered 2022-12-01: 1000 mg via ORAL
  Filled 2022-12-01: qty 2
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
## 2022-12-01 NOTE — ED Triage Notes (Signed)
Patient reports chronic left knee pain for several months , ambulatory /no swelling . Denies recent injury.

## 2022-12-01 NOTE — ED Notes (Addendum)
Pt was audibly talking to himself while looking around after cussing at another pt in the waiting room according to Opal Sidles, Alice, and [employer], Animal nutritionist.

## 2022-12-02 ENCOUNTER — Emergency Department (HOSPITAL_COMMUNITY)
Admission: EM | Admit: 2022-12-02 | Discharge: 2022-12-02 | Disposition: A | Discharge status: Home / Self Care | Payer: Self-pay | Attending: Emergency Medicine | Admitting: Emergency Medicine

## 2022-12-02 ENCOUNTER — Other Ambulatory Visit: Payer: Self-pay

## 2022-12-02 DIAGNOSIS — M25561 Pain in right knee: Secondary | ICD-10-CM | POA: Insufficient documentation

## 2022-12-02 DIAGNOSIS — G8929 Other chronic pain: Secondary | ICD-10-CM | POA: Insufficient documentation

## 2022-12-02 MED ORDER — ACETAMINOPHEN 500 MG PO TABS
1000.0000 mg | ORAL_TABLET | Freq: Once | ORAL | Status: AC
  Administered 2022-12-02: 1000 mg via ORAL
  Filled 2022-12-02: qty 2

## 2022-12-02 NOTE — ED Provider Notes (Signed)
[facility] Provider Note   CSN: [id number] Arrival date & time: 12/02/22  0128     History  Chief Complaint  Patient presents with   Leg Pain    Zachary Kelley is a 42 y.o. male.  The history is provided by the patient and medical records.  Leg Pain  42 y.o. M here with chronic right knee pain.  Denies new injury/trauma.  Seen last night for same.  Requesting something for pain.  Home Medications Prior to Admission medications   Medication Sig Start Date End Date Taking? Authorizing Provider  amoxicillin-clavulanate (AUGMENTIN) 875-125 MG tablet Take 1 tablet by mouth every 12 (twelve) hours. 11/04/22   Small, Brooke L, PA  diphenhydrAMINE (BENADRYL) 25 MG tablet Take 1 tablet (25 mg total) by mouth every 6 (six) hours as needed. 07/02/22   Mesner, Corene Cornea, MD  ibuprofen (ADVIL) 400 MG tablet Take 1 tablet (400 mg total) by mouth every 6 (six) hours as needed. 11/24/22   Mickie Hillier, PA-C  nystatin cream (MYCOSTATIN) Apply 1 Application topically 4 (four) times daily. Apply to affected area every 4-6 hours x 10 days 07/02/22   Mesner, Corene Cornea, MD      Allergies    Patient has no known allergies.    Review of Systems   Review of Systems  Musculoskeletal:  Positive for arthralgias.  All other systems reviewed and are negative.   Physical Exam Updated Vital Signs BP (!) 163/115   Pulse 91   Temp (!) 97.5 F (36.4 C) (Oral)   Resp 18   SpO2 99%   Physical Exam Vitals and nursing note reviewed.  Constitutional:      Appearance: He is well-developed.  HENT:     Head: Normocephalic and atraumatic.  Eyes:     Conjunctiva/sclera: Conjunctivae normal.     Pupils: Pupils are equal, round, and reactive to light.  Cardiovascular:     Rate and Rhythm: Normal rate and regular rhythm.     Heart sounds: Normal heart sounds.  Pulmonary:     Effort: Pulmonary effort is normal.     Breath sounds: Normal breath sounds.  Abdominal:      General: Bowel sounds are normal.     Palpations: Abdomen is soft.  Musculoskeletal:        General: Normal range of motion.     Cervical back: Normal range of motion.     Comments: Right knee pain without acute deformity, no erythema/induration, leg is NVI  Skin:    General: Skin is warm and dry.  Neurological:     Mental Status: He is alert and oriented to person, place, and time.     ED Results / Procedures / Treatments   Labs (all labs ordered are listed, but only abnormal results are displayed) Labs Reviewed - No data to display  EKG None  Radiology No results found.  Procedures Procedures    Medications Ordered in ED Medications  acetaminophen (TYLENOL) tablet 1,000 mg (1,000 mg Oral Given 12/02/22 0544)    ED Course/ Medical Decision Making/ A&P                             Medical Decision Making Risk OTC drugs.   42 y.o. M here with chronic right knee pain.  No new injury/trauma.  No erythema/induration.  Do not feel he needs imaging today.  Tylenol given for pain.  Stable for  discharge.  Return here for new concerns.  Final Clinical Impression(s) / ED Diagnoses Final diagnoses:  Chronic pain of right knee    Rx / DC Orders ED Discharge Orders     None         Larene Pickett, PA-C 12/02/22 0557    Orpah Greek, MD 12/03/22 [phone]

## 2022-12-02 NOTE — ED Triage Notes (Addendum)
Pt c/o chronic left pain that has been ongoing for several months. No swelling. No new injury/trauma. Ambulatory in triage.

## 2023-01-07 ENCOUNTER — Emergency Department (HOSPITAL_COMMUNITY)
Admission: EM | Admit: 2023-01-07 | Discharge: 2023-01-07 | Disposition: A | Discharge status: Home / Self Care | Payer: Self-pay | Attending: Emergency Medicine | Admitting: Emergency Medicine

## 2023-01-07 ENCOUNTER — Other Ambulatory Visit: Payer: Self-pay

## 2023-01-07 ENCOUNTER — Encounter (HOSPITAL_COMMUNITY): Payer: Self-pay

## 2023-01-07 DIAGNOSIS — M25562 Pain in left knee: Secondary | ICD-10-CM | POA: Insufficient documentation

## 2023-01-07 NOTE — ED Triage Notes (Signed)
Pt reports left knee pain that started today. He denies injury. Pt is ambulatory with independent steady gait.

## 2023-01-07 NOTE — ED Notes (Signed)
Pt ambulatory at d/c with independent steady gait. Pt verbalized understanding of d/c instructions and follow up care.

## 2023-01-07 NOTE — ED Provider Notes (Signed)
[facility] Provider Note   CSN: [id number] Arrival date & time: 01/07/23  2236     History  Chief Complaint  Patient presents with   Knee Pain    Zachary Kelley is a 42 y.o. male.  The history is provided by the patient and medical records.   42 y.o. M here with left knee pain.  Denies fall/trauma, just states it started hurting.  He remains ambulatory in triage.    Home Medications Prior to Admission medications   Medication Sig Start Date End Date Taking? Authorizing Provider  amoxicillin-clavulanate (AUGMENTIN) 875-125 MG tablet Take 1 tablet by mouth every 12 (twelve) hours. 11/04/22   Small, Brooke L, PA  diphenhydrAMINE (BENADRYL) 25 MG tablet Take 1 tablet (25 mg total) by mouth every 6 (six) hours as needed. 07/02/22   Mesner, Corene Cornea, MD  ibuprofen (ADVIL) 400 MG tablet Take 1 tablet (400 mg total) by mouth every 6 (six) hours as needed. 11/24/22   Mickie Hillier, PA-C  nystatin cream (MYCOSTATIN) Apply 1 Application topically 4 (four) times daily. Apply to affected area every 4-6 hours x 10 days 07/02/22   Mesner, Corene Cornea, MD      Allergies    Patient has no known allergies.    Review of Systems   Review of Systems  Musculoskeletal:  Positive for arthralgias.  All other systems reviewed and are negative.   Physical Exam Updated Vital Signs BP (!) 147/96 (BP Location: Right Arm)   Pulse (!) 108   Temp 98.1 F (36.7 C) (Oral)   Resp 18   SpO2 99%   Physical Exam Vitals and nursing note reviewed.  Constitutional:      Appearance: He is well-developed.     Comments: Walking into triage with little [employer] pizza  HENT:     Head: Normocephalic and atraumatic.  Eyes:     Conjunctiva/sclera: Conjunctivae normal.     Pupils: Pupils are equal, round, and reactive to light.  Cardiovascular:     Rate and Rhythm: Normal rate and regular rhythm.     Heart sounds: Normal heart sounds.  Pulmonary:     Effort: Pulmonary  effort is normal.     Breath sounds: Normal breath sounds.  Musculoskeletal:        General: Normal range of motion.     Cervical back: Normal range of motion.  Skin:    General: Skin is warm and dry.  Neurological:     Mental Status: He is alert and oriented to person, place, and time.  Psychiatric:     Comments: Making inappropriate comments to staff     ED Results / Procedures / Treatments   Labs (all labs ordered are listed, but only abnormal results are displayed) Labs Reviewed - No data to display  EKG None  Radiology No results found.  Procedures Procedures    Medications Ordered in ED Medications - No data to display  ED Course/ Medical Decision Making/ A&P                             Medical Decision Making  42 y.o. M here with left knee pain.  Chronic issue for him.  He is ambulatory with pizza in triage.  NAD noted.  VSS.  Do not feel he needs emergent imaging.  Stable for discharge.  Final Clinical Impression(s) / ED Diagnoses Final diagnoses:  Acute pain of left knee  Rx / DC Orders ED Discharge Orders     None         Kathryne Hitch 01/07/23 2259    Isla Pence, MD 01/07/23 2318

## 2023-02-26 ENCOUNTER — Encounter (HOSPITAL_COMMUNITY): Payer: Self-pay

## 2023-02-26 ENCOUNTER — Emergency Department (HOSPITAL_BASED_OUTPATIENT_CLINIC_OR_DEPARTMENT_OTHER)
Admission: EM | Admit: 2023-02-26 | Discharge: 2023-02-27 | Disposition: A | Discharge status: Home / Self Care | Payer: Self-pay | Attending: Emergency Medicine | Admitting: Emergency Medicine

## 2023-02-26 ENCOUNTER — Emergency Department (HOSPITAL_COMMUNITY): Payer: Self-pay

## 2023-02-26 ENCOUNTER — Other Ambulatory Visit: Payer: Self-pay

## 2023-02-26 ENCOUNTER — Encounter (HOSPITAL_BASED_OUTPATIENT_CLINIC_OR_DEPARTMENT_OTHER): Payer: Self-pay | Admitting: Emergency Medicine

## 2023-02-26 ENCOUNTER — Emergency Department (HOSPITAL_COMMUNITY)
Admission: EM | Admit: 2023-02-26 | Discharge: 2023-02-26 | Disposition: A | Discharge status: Home / Self Care | Payer: Self-pay | Attending: Emergency Medicine | Admitting: Emergency Medicine

## 2023-02-26 DIAGNOSIS — M25562 Pain in left knee: Secondary | ICD-10-CM | POA: Insufficient documentation

## 2023-02-26 DIAGNOSIS — W1830XA Fall on same level, unspecified, initial encounter: Secondary | ICD-10-CM | POA: Insufficient documentation

## 2023-02-26 DIAGNOSIS — R519 Headache, unspecified: Secondary | ICD-10-CM | POA: Insufficient documentation

## 2023-02-26 DIAGNOSIS — Y92410 Unspecified street and highway as the place of occurrence of the external cause: Secondary | ICD-10-CM | POA: Insufficient documentation

## 2023-02-26 MED ORDER — IBUPROFEN 800 MG PO TABS
800.0000 mg | ORAL_TABLET | Freq: Once | ORAL | Status: AC
  Administered 2023-02-26: 800 mg via ORAL
  Filled 2023-02-26: qty 1

## 2023-02-26 NOTE — ED Provider Notes (Signed)
[place] [facility]PART[facility] AT [facility] Provider Note   CSN: [id number] Arrival date & time: 02/26/23  0133     History  Chief Complaint  Patient presents with   Knee Pain   HPI Zachary Kelley is a 42 y.o. male presenting for left knee pain.  States he fell and landed on his left knee yesterday evening.  Endorses pain about the superior aspect of the knee joint.  States he can still ambulate and bear weight.  Denies any buckling or laxity in the knee joint.  Denies swelling.  States the pain has progressively worsened since yesterday prompting his evaluation today.  Patient also requesting a sandwich and something to drink.   Knee Pain      Home Medications Prior to Admission medications   Medication Sig Start Date End Date Taking? Authorizing Provider  amoxicillin-clavulanate (AUGMENTIN) 875-125 MG tablet Take 1 tablet by mouth every 12 (twelve) hours. 11/04/22   Small, Brooke L, PA  diphenhydrAMINE (BENADRYL) 25 MG tablet Take 1 tablet (25 mg total) by mouth every 6 (six) hours as needed. 07/02/22   Mesner, Barbara Cower, MD  ibuprofen (ADVIL) 400 MG tablet Take 1 tablet (400 mg total) by mouth every 6 (six) hours as needed. 11/24/22   Cristopher Peru, PA-C  nystatin cream (MYCOSTATIN) Apply 1 Application topically 4 (four) times daily. Apply to affected area every 4-6 hours x 10 days 07/02/22   Mesner, Barbara Cower, MD      Allergies    Patient has no known allergies.    Review of Systems   See HPI for pertinent positives  Physical Exam Updated Vital Signs BP 114/80 (BP Location: Left Arm)   Pulse 100   Temp 98.5 F (36.9 C) (Oral)   Resp 14   SpO2 100%  Physical Exam Musculoskeletal:     Right knee: Normal.     Left knee: No swelling, deformity, effusion, erythema, ecchymosis, lacerations, bony tenderness or crepitus. Normal range of motion. Tenderness present. Normal pulse.     Comments: Generalized tenderness with palpation about the superior aspect of the left  knee joint.     ED Results / Procedures / Treatments   Labs (all labs ordered are listed, but only abnormal results are displayed) Labs Reviewed - No data to display  EKG None  Radiology DG Knee Complete 4 Views Left  Result Date: 02/26/2023 CLINICAL DATA:  Left knee pain following fall, initial encounter EXAM: LEFT KNEE - COMPLETE 4+ VIEW COMPARISON:  11/20/2022 FINDINGS: Tricompartmental degenerative changes are noted worst in the lateral joint space. No acute fracture or dislocation is noted. Calcification is noted along the medial aspect of the distal femur consistent with prior MCL injury. No acute abnormality is noted. 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 No acute abnormality noted. Electronically Signed   By: Alcide Clever M.D.   On: 02/26/2023 03:10    Procedures Procedures    Medications Ordered in ED Medications  ibuprofen (ADVIL) tablet 800 mg (has no administration in time range)    ED Course/ Medical Decision Making/ A&P                             Medical Decision Making Amount and/or Complexity of Data Reviewed Radiology: ordered.  Risk Prescription drug management.   42 year old well-appearing male presenting for knee pain status post fall yesterday.  Exam was unremarkable.  DDx includes dislocation and fracture of the left  knee along with soft tissue injury.  X-ray of the left knee revealed tricompartmental degenerative changes of the lateral aspect of the knee along with calcification about the medial aspect of the knee suggestive of a prior MCL injury otherwise no acute findings.  Advised patient of these findings.  Treated his pain with ibuprofen. Suspect knee pain may be more chronic in nature. Provided food and soda for him.  Recommend conservative treatment at home for his knee pain.  Advised to follow-up with his PCP for findings on the x-ray.          Final Clinical Impression(s) / ED Diagnoses Final diagnoses:  Left  knee pain, unspecified chronicity    Rx / DC Orders ED Discharge Orders     None         Gareth Eagle, PA-C 02/26/23 0319    Nira Conn, MD 02/26/23 [phone]

## 2023-02-26 NOTE — ED Notes (Signed)
Patient refusing to leave. Security at bedside.

## 2023-02-26 NOTE — ED Notes (Signed)
Ambulated to restroom without assistance 

## 2023-02-26 NOTE — ED Triage Notes (Signed)
Patient brought in by ems for complaints of knee pain, and being hungry.  Patient has asked multiple times for a sandwich before triage is done.

## 2023-02-26 NOTE — Discharge Instructions (Signed)
Evaluation of your left knee was overall reassuring.  It did show some degenerative changes in the left knee along with some calcification of the medial aspect of the knee suggestive of a prior MCL injury.  Otherwise there are no acute findings on x-ray.  Recommend conservative treatment of your knee pain which includes rest, compression, ice and elevation.  You can ice the knee 3-4 times a day for 15 minutes for the next 72 hours.  Recommend you follow-up with your PCP for further evaluation.

## 2023-02-26 NOTE — ED Triage Notes (Addendum)
Pt presents via EMS for L knee pain after [place] [address], [place]. Answers orientation questions correctly but poor attention  Denies drugs or alcohol, EMS notes odor of EtOH. Verbally aggressive towards others in lobby.

## 2023-02-27 ENCOUNTER — Emergency Department (HOSPITAL_BASED_OUTPATIENT_CLINIC_OR_DEPARTMENT_OTHER): Payer: Self-pay

## 2023-02-27 ENCOUNTER — Emergency Department (HOSPITAL_BASED_OUTPATIENT_CLINIC_OR_DEPARTMENT_OTHER): Admission: EM | Admit: 2023-02-27 | Discharge: 2023-02-28 | Discharge status: Against Medical Advice | Payer: Self-pay

## 2023-02-27 ENCOUNTER — Other Ambulatory Visit: Payer: Self-pay

## 2023-02-27 LAB — CBC WITH DIFFERENTIAL/PLATELET
Abs Immature Granulocytes: 0 10*3/uL (ref 0.00–0.07)
Basophils Absolute: 0 10*3/uL (ref 0.0–0.1)
Basophils Relative: 0 %
Eosinophils Absolute: 0.1 10*3/uL (ref 0.0–0.5)
Eosinophils Relative: 2 %
HCT: 34.6 % — ABNORMAL LOW (ref 39.0–52.0)
Hemoglobin: 12.1 g/dL — ABNORMAL LOW (ref 13.0–17.0)
Immature Granulocytes: 0 %
Lymphocytes Relative: 55 %
Lymphs Abs: 2.6 10*3/uL (ref 0.7–4.0)
MCH: 30.2 pg (ref 26.0–34.0)
MCHC: 35 g/dL (ref 30.0–36.0)
MCV: 86.3 fL (ref 80.0–100.0)
Monocytes Absolute: 0.3 10*3/uL (ref 0.1–1.0)
Monocytes Relative: 6 %
Neutro Abs: 1.8 10*3/uL (ref 1.7–7.7)
Neutrophils Relative %: 37 %
Platelets: 231 10*3/uL (ref 150–400)
RBC: 4.01 MIL/uL — ABNORMAL LOW (ref 4.22–5.81)
RDW: 12 % (ref 11.5–15.5)
WBC: 4.8 10*3/uL (ref 4.0–10.5)
nRBC: 0 % (ref 0.0–0.2)

## 2023-02-27 LAB — BASIC METABOLIC PANEL
Anion gap: 10 (ref 5–15)
BUN: 13 mg/dL (ref 6–20)
CO2: 28 mmol/L (ref 22–32)
Calcium: 8.1 mg/dL — ABNORMAL LOW (ref 8.9–10.3)
Chloride: 107 mmol/L (ref 98–111)
Creatinine, Ser: 0.94 mg/dL (ref 0.61–1.24)
GFR, Estimated: >60 mL/min (ref >60)
Glucose, Bld: 111 mg/dL — ABNORMAL HIGH (ref 70–99)
Potassium: 3.6 mmol/L (ref 3.5–5.1)
Sodium: 145 mmol/L (ref 135–145)

## 2023-02-27 MED ORDER — IOHEXOL 350 MG/ML SOLN
125.0000 mL | Freq: Once | INTRAVENOUS | Status: AC | PRN
  Administered 2023-02-27: 125 mL via INTRAVENOUS

## 2023-02-27 MED ORDER — IBUPROFEN 400 MG PO TABS: 400.0000 mg | ORAL_TABLET | Freq: Four times a day (QID) | ORAL | 0 refills | Status: DC | PRN

## 2023-02-27 MED ORDER — LIDOCAINE 5 % EX PTCH: 1.0000 | MEDICATED_PATCH | CUTANEOUS | 0 refills | Status: AC
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
## 2023-02-27 NOTE — ED Notes (Addendum)
Patient verbalizes understanding of discharge instructions. Opportunity for questioning and answers were provided. Armband removed by staff, pt discharged from ED on foot. Gait steady, declines offer for use of phone to call family/friend. Able to answer orientation questions at discharge. Now calm and cooperative.
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
## 2023-03-10 ENCOUNTER — Encounter (HOSPITAL_COMMUNITY): Payer: Self-pay | Admitting: Emergency Medicine

## 2023-03-10 ENCOUNTER — Emergency Department (HOSPITAL_COMMUNITY)
Admission: EM | Admit: 2023-03-10 | Discharge: 2023-03-11 | Discharge status: Against Medical Advice | Payer: Self-pay | Attending: Emergency Medicine | Admitting: Emergency Medicine

## 2023-03-10 ENCOUNTER — Emergency Department (HOSPITAL_COMMUNITY)
Admission: EM | Admit: 2023-03-10 | Discharge: 2023-03-10 | Disposition: A | Discharge status: Home / Self Care | Payer: Self-pay | Attending: Emergency Medicine | Admitting: Emergency Medicine

## 2023-03-10 ENCOUNTER — Other Ambulatory Visit: Payer: Self-pay

## 2023-03-10 DIAGNOSIS — Y909 Presence of alcohol in blood, level not specified: Secondary | ICD-10-CM | POA: Insufficient documentation

## 2023-03-10 DIAGNOSIS — F10129 Alcohol abuse with intoxication, unspecified: Secondary | ICD-10-CM | POA: Insufficient documentation

## 2023-03-10 DIAGNOSIS — Z5321 Procedure and treatment not carried out due to patient leaving prior to being seen by health care provider: Secondary | ICD-10-CM | POA: Insufficient documentation

## 2023-03-10 DIAGNOSIS — M25562 Pain in left knee: Secondary | ICD-10-CM | POA: Insufficient documentation

## 2023-03-10 DIAGNOSIS — F10920 Alcohol use, unspecified with intoxication, uncomplicated: Secondary | ICD-10-CM

## 2023-03-10 DIAGNOSIS — M79662 Pain in left lower leg: Secondary | ICD-10-CM | POA: Insufficient documentation

## 2023-03-10 NOTE — ED Notes (Signed)
DC instructions reviewed with pt. Pt verbalized understanding.  PT DC.  
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
## 2023-03-11 ENCOUNTER — Other Ambulatory Visit: Payer: Self-pay

## 2023-03-11 NOTE — ED Triage Notes (Signed)
Pt c/o left lower leg pain. Was uncooperative in triage. Unable to provide further information. Pt requested a wheelchair, despite being ambulatory without difficulty. [employer] and security at bedside during triage.

## 2023-03-11 NOTE — ED Notes (Signed)
Called for triage and pt looked at this NT, shook his head and walked away to the bathroom.

## 2023-03-11 NOTE — ED Notes (Signed)
Pt called [facility] to go to a room. Pt could not be found.
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
## 2023-03-16 ENCOUNTER — Emergency Department (HOSPITAL_COMMUNITY): Payer: Self-pay

## 2023-03-16 ENCOUNTER — Other Ambulatory Visit: Payer: Self-pay

## 2023-03-16 ENCOUNTER — Encounter (HOSPITAL_COMMUNITY): Payer: Self-pay | Admitting: Emergency Medicine

## 2023-03-16 ENCOUNTER — Emergency Department (HOSPITAL_COMMUNITY)
Admission: EM | Admit: 2023-03-16 | Discharge: 2023-03-16 | Disposition: A | Discharge status: Home / Self Care | Payer: Self-pay | Attending: Student | Admitting: Student

## 2023-03-16 DIAGNOSIS — M25562 Pain in left knee: Secondary | ICD-10-CM | POA: Insufficient documentation

## 2023-03-16 DIAGNOSIS — G8929 Other chronic pain: Secondary | ICD-10-CM | POA: Insufficient documentation

## 2023-03-16 NOTE — Discharge Instructions (Addendum)
Take Tylenol ibuprofen for your pain.  Your x-ray did not show any acute fractures.  You have severe arthritis in your knee.  Please follow-up with orthopedics, provided their information in your discharge paperwork.

## 2023-03-16 NOTE — ED Notes (Signed)
Patient refused d/c vitals.

## 2023-03-16 NOTE — ED Notes (Signed)
Patient transported to X-ray 
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
## 2023-03-17 ENCOUNTER — Emergency Department (HOSPITAL_COMMUNITY)
Admission: EM | Admit: 2023-03-17 | Discharge: 2023-03-17 | Discharge status: Against Medical Advice | Payer: Self-pay | Attending: Emergency Medicine | Admitting: Emergency Medicine

## 2023-03-17 DIAGNOSIS — H5789 Other specified disorders of eye and adnexa: Secondary | ICD-10-CM | POA: Insufficient documentation

## 2023-03-17 DIAGNOSIS — Z5321 Procedure and treatment not carried out due to patient leaving prior to being seen by health care provider: Secondary | ICD-10-CM | POA: Insufficient documentation

## 2023-03-17 NOTE — ED Triage Notes (Signed)
Pt uncooperative in triage and would not answer any of the RNs questions for triage. Pt pointed at RN and rolled his eyes when asked why he came to ER.

## 2023-03-17 NOTE — ED Notes (Addendum)
I have told pt multiple times he has a bed he wont get up. I ask pt multiples times does he still want to see the doctor pt said NO.

## 2023-03-18 ENCOUNTER — Emergency Department (HOSPITAL_COMMUNITY)
Admission: EM | Admit: 2023-03-18 | Discharge: 2023-03-19 | Disposition: A | Discharge status: Home / Self Care | Payer: Self-pay | Attending: Emergency Medicine | Admitting: Emergency Medicine

## 2023-03-18 ENCOUNTER — Other Ambulatory Visit: Payer: Self-pay

## 2023-03-18 DIAGNOSIS — M25562 Pain in left knee: Secondary | ICD-10-CM | POA: Insufficient documentation
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
## 2023-03-19 ENCOUNTER — Emergency Department (HOSPITAL_COMMUNITY)
Admission: EM | Admit: 2023-03-19 | Discharge: 2023-03-19 | Disposition: A | Discharge status: Home / Self Care | Payer: Self-pay | Attending: Emergency Medicine | Admitting: Emergency Medicine

## 2023-03-19 ENCOUNTER — Encounter (HOSPITAL_COMMUNITY): Payer: Self-pay

## 2023-03-19 DIAGNOSIS — G8929 Other chronic pain: Secondary | ICD-10-CM

## 2023-03-19 DIAGNOSIS — M25562 Pain in left knee: Secondary | ICD-10-CM | POA: Insufficient documentation

## 2023-03-19 NOTE — Discharge Instructions (Addendum)
You can take Motrin and/or Tylenol as needed as directed for pain.  Please follow-up with a primary care provider for further care of your knee.
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
## 2023-03-19 NOTE — ED Provider Notes (Signed)
  [place] EMERGENCY DEPARTMENT AT [facility] Provider Note   CSN: [id number] Arrival date & time: 03/19/23  1920     History {Add pertinent medical, surgical, social history, OB history to HPI:1} Chief Complaint  Patient presents with   Knee Pain    Zachary Kelley is a 42 y.o. male.  HPI     Home Medications Prior to Admission medications   Medication Sig Start Date End Date Taking? Authorizing Provider  amoxicillin-clavulanate (AUGMENTIN) 875-125 MG tablet Take 1 tablet by mouth every 12 (twelve) hours. 11/04/22   Small, Brooke L, PA  diphenhydrAMINE (BENADRYL) 25 MG tablet Take 1 tablet (25 mg total) by mouth every 6 (six) hours as needed. 07/02/22   Mesner, Barbara Cower, MD  ibuprofen (ADVIL) 400 MG tablet Take 1 tablet (400 mg total) by mouth every 6 (six) hours as needed. 11/24/22   Cristopher Peru, PA-C  ibuprofen (ADVIL) 400 MG tablet Take 1 tablet (400 mg total) by mouth every 6 (six) hours as needed. 02/27/23   Palumbo, April, MD  lidocaine (LIDODERM) 5 % Place 1 patch onto the skin daily. Remove & Discard patch within 12 hours or as directed by MD 02/27/23   Nicanor Alcon, April, MD  nystatin cream (MYCOSTATIN) Apply 1 Application topically 4 (four) times daily. Apply to affected area every 4-6 hours x 10 days 07/02/22   Mesner, Barbara Cower, MD      Allergies    Patient has no known allergies.    Review of Systems   Review of Systems  Physical Exam Updated Vital Signs BP (!) 130/102 (BP Location: Right Arm)   Pulse 67   Temp 98.2 F (36.8 C)   Resp 18   Ht 6' (1.829 m)   Wt 77.6 kg   SpO2 98%   BMI 23.19 kg/m  Physical Exam  ED Results / Procedures / Treatments   Labs (all labs ordered are listed, but only abnormal results are displayed) Labs Reviewed - No data to display  EKG None  Radiology No results found.  Procedures Procedures  {Document cardiac monitor, telemetry assessment procedure when appropriate:1}  Medications Ordered in ED Medications - No  data to display  ED Course/ Medical Decision Making/ A&P   {   Click here for ABCD2, HEART and other calculatorsREFRESH Note before signing :1}                          Medical Decision Making  ***  {Document critical care time when appropriate:1} {Document review of labs and clinical decision tools ie heart score, Chads2Vasc2 etc:1}  {Document your independent review of radiology images, and any outside records:1} {Document your discussion with family members, caretakers, and with consultants:1} {Document social determinants of health affecting pt's care:1} {Document your decision making why or why not admission, treatments were needed:1} Final Clinical Impression(s) / ED Diagnoses Final diagnoses:  None    Rx / DC Orders ED Discharge Orders     None

## 2023-03-19 NOTE — Discharge Instructions (Signed)
You were seen for your knee pain in the emergency department.   At home, please take tylenol and ibuprofen for your pain.    Follow-up with your primary doctor in 2-3 days regarding your visit.    Return immediately to the emergency department if you experience any of the following: worsening pain, inability to walk, fever, or any other concerning symptoms.    Thank you for visiting our Emergency Department. It was a pleasure taking care of you today.

## 2023-03-19 NOTE — ED Triage Notes (Addendum)
Pt arrives with c/o left knee pain that has been going on for months. Pt ambulatory to triage.

## 2023-03-20 ENCOUNTER — Other Ambulatory Visit: Payer: Self-pay

## 2023-03-20 ENCOUNTER — Encounter (HOSPITAL_COMMUNITY): Payer: Self-pay | Admitting: *Deleted

## 2023-03-20 ENCOUNTER — Emergency Department (HOSPITAL_COMMUNITY)
Admission: EM | Admit: 2023-03-20 | Discharge: 2023-03-20 | Disposition: A | Discharge status: Home / Self Care | Payer: Self-pay | Attending: Emergency Medicine | Admitting: Emergency Medicine

## 2023-03-20 DIAGNOSIS — M1712 Unilateral primary osteoarthritis, left knee: Secondary | ICD-10-CM | POA: Insufficient documentation

## 2023-03-20 DIAGNOSIS — S8990XA Unspecified injury of unspecified lower leg, initial encounter: Secondary | ICD-10-CM

## 2023-03-20 DIAGNOSIS — X58XXXA Exposure to other specified factors, initial encounter: Secondary | ICD-10-CM | POA: Insufficient documentation

## 2023-03-20 DIAGNOSIS — S83412A Sprain of medial collateral ligament of left knee, initial encounter: Secondary | ICD-10-CM | POA: Insufficient documentation

## 2023-03-20 DIAGNOSIS — G8929 Other chronic pain: Secondary | ICD-10-CM

## 2023-03-20 MED ORDER — IBUPROFEN 600 MG PO TABS: 600.0000 mg | ORAL_TABLET | Freq: Three times a day (TID) | ORAL | 0 refills | Status: AC | PRN

## 2023-03-20 MED ORDER — KETOROLAC TROMETHAMINE 15 MG/ML IJ SOLN
15.0000 mg | Freq: Once | INTRAMUSCULAR | Status: AC
  Administered 2023-03-20: 15 mg via INTRAMUSCULAR
  Filled 2023-03-20: qty 1

## 2023-03-20 MED ORDER — ACETAMINOPHEN 500 MG PO TABS: 1000.0000 mg | ORAL_TABLET | Freq: Four times a day (QID) | ORAL | Status: DC | PRN

## 2023-03-20 NOTE — Discharge Instructions (Addendum)
We gave you a pain medication in the ED to help with your knee.   I provided primary care clinics that work with patients experiencing financial difficulties. Please attempt to set up an appointment with one of these clinics. They can help you better manage your knee pain long-term.   You may take Tylenol and/or ibuprofen at home as needed for pain. You may take up to 1000mg  Tylenol every 6 hours and 600mg  ibuprofen every 6 hours. Do not take more than 4000mg  Tylenol or 3200mg  ibuprofen in 24 hours.   I provided several resources you may benefit from as well.  For any new injury or other concerns, please return to nearest ED for re-evaluation.
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
## 2023-03-21 ENCOUNTER — Other Ambulatory Visit: Payer: Self-pay

## 2023-03-21 ENCOUNTER — Encounter (HOSPITAL_COMMUNITY): Payer: Self-pay | Admitting: Emergency Medicine

## 2023-03-21 ENCOUNTER — Emergency Department (HOSPITAL_COMMUNITY)
Admission: EM | Admit: 2023-03-21 | Discharge: 2023-03-21 | Disposition: A | Discharge status: Home / Self Care | Payer: Self-pay | Attending: Emergency Medicine | Admitting: Emergency Medicine

## 2023-03-21 DIAGNOSIS — M25562 Pain in left knee: Secondary | ICD-10-CM | POA: Insufficient documentation

## 2023-03-21 DIAGNOSIS — G8929 Other chronic pain: Secondary | ICD-10-CM | POA: Insufficient documentation

## 2023-03-21 MED ORDER — KETOROLAC TROMETHAMINE 15 MG/ML IJ SOLN
15.0000 mg | Freq: Once | INTRAMUSCULAR | Status: AC
  Administered 2023-03-21: 15 mg via INTRAMUSCULAR
  Filled 2023-03-21: qty 1

## 2023-03-21 NOTE — Discharge Instructions (Addendum)
Your exam today was reassuring.  You received a shot of Toradol in the emergency department.  For any concerning symptoms return to the emergency department otherwise you received a referral to orthopedics.

## 2023-03-21 NOTE — ED Provider Notes (Signed)
[place] EMERGENCY DEPARTMENT AT [facility] Provider Note   CSN: [id number] Arrival date & time: 03/21/23  4098     History  Chief Complaint  Patient presents with   Knee Pain    Alee Liva is a 42 y.o. male.  42 year old male presents today for chronic left knee pain.  He states "my ligaments are still bothering me".  Denies any new complaints.  Denies any new injury.  He has not taken anything over-the-counter.  Does not have a supportive brace.  Was recently seen in the emergency department for the same complaint and had imaging done.  States he has not been able to follow-up with orthopedist.  The history is provided by the patient. No language interpreter was used.       Home Medications Prior to Admission medications   Medication Sig Start Date End Date Taking? Authorizing Provider  amoxicillin-clavulanate (AUGMENTIN) 875-125 MG tablet Take 1 tablet by mouth every 12 (twelve) hours. 11/04/22   Small, Brooke L, PA  diphenhydrAMINE (BENADRYL) 25 MG tablet Take 1 tablet (25 mg total) by mouth every 6 (six) hours as needed. 07/02/22   Mesner, Barbara Cower, MD  ibuprofen (ADVIL) 600 MG tablet Take 1 tablet (600 mg total) by mouth every 8 (eight) hours as needed for up to 5 days for mild pain or moderate pain. 03/20/23 03/25/23  Gowens, Mariah L, PA-C  lidocaine (LIDODERM) 5 % Place 1 patch onto the skin daily. Remove & Discard patch within 12 hours or as directed by MD 02/27/23   Nicanor Alcon, April, MD  nystatin cream (MYCOSTATIN) Apply 1 Application topically 4 (four) times daily. Apply to affected area every 4-6 hours x 10 days 07/02/22   Mesner, Barbara Cower, MD      Allergies    Patient has no known allergies.    Review of Systems   Review of Systems  Constitutional:  Negative for fever.  Musculoskeletal:  Positive for arthralgias.  All other systems reviewed and are negative.   Physical Exam Updated Vital Signs BP 117/63 (BP Location: Right Arm)   Pulse (!) 109   Temp 99  F (37.2 C) (Oral)   Resp 18   SpO2 96%  Physical Exam Vitals and nursing note reviewed.  Constitutional:      General: He is not in acute distress.    Appearance: Normal appearance. He is not ill-appearing.  HENT:     Head: Normocephalic and atraumatic.     Nose: Nose normal.  Eyes:     Conjunctiva/sclera: Conjunctivae normal.  Pulmonary:     Effort: Pulmonary effort is normal. No respiratory distress.  Musculoskeletal:        General: No deformity. Normal range of motion.     Cervical back: Normal range of motion.     Comments: Full range of motion in bilateral lower extremities.  Left knee without tenderness to palpation.  Neurovascularly intact.  Without deformity or obvious swelling.  Skin:    Findings: No rash.  Neurological:     Mental Status: He is alert.     ED Results / Procedures / Treatments   Labs (all labs ordered are listed, but only abnormal results are displayed) Labs Reviewed - No data to display  EKG None  Radiology No results found.  Procedures Procedures    Medications Ordered in ED Medications  ketorolac (TORADOL) 15 MG/ML injection 15 mg (has no administration in time range)    ED Course/ Medical Decision Making/ A&P  Medical Decision Making Risk Prescription drug management.   42 year old male presents today for concern about left knee pain.  This is chronic.  He is able ambulate without difficulty.  Has full range of motion.  Afebrile and without tachycardia.  Low suspicion for septic joint.  Chronic pain.  Orthopedist referral given. Patient request Toradol.  Will provide dose.  He is appropriate for discharge.  He is appropriate for discharge.   Final Clinical Impression(s) / ED Diagnoses Final diagnoses:  Chronic pain of left knee    Rx / DC Orders ED Discharge Orders     None         Marita Kansas, PA-C 03/21/23 1610    Gerhard Munch, MD 03/21/23 0730
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
## 2023-06-20 ENCOUNTER — Emergency Department (HOSPITAL_COMMUNITY)
Admission: EM | Admit: 2023-06-20 | Discharge: 2023-06-21 | Disposition: A | Discharge status: Home / Self Care | Payer: Self-pay | Attending: Emergency Medicine | Admitting: Emergency Medicine

## 2023-06-20 DIAGNOSIS — G8929 Other chronic pain: Secondary | ICD-10-CM | POA: Insufficient documentation

## 2023-06-20 DIAGNOSIS — F10120 Alcohol abuse with intoxication, uncomplicated: Secondary | ICD-10-CM | POA: Insufficient documentation

## 2023-06-20 DIAGNOSIS — Y906 Blood alcohol level of 120-199 mg/100 ml: Secondary | ICD-10-CM | POA: Insufficient documentation

## 2023-06-20 DIAGNOSIS — Z5900 Homelessness unspecified: Secondary | ICD-10-CM | POA: Insufficient documentation

## 2023-06-20 DIAGNOSIS — F10920 Alcohol use, unspecified with intoxication, uncomplicated: Secondary | ICD-10-CM

## 2023-06-20 DIAGNOSIS — M25562 Pain in left knee: Secondary | ICD-10-CM | POA: Insufficient documentation

## 2023-06-20 NOTE — ED Notes (Signed)
Pt called for triage, no answer

## 2023-06-20 NOTE — ED Notes (Signed)
Pt called for triage no answer  °

## 2023-06-21 ENCOUNTER — Other Ambulatory Visit: Payer: Self-pay

## 2023-06-21 ENCOUNTER — Emergency Department (HOSPITAL_COMMUNITY)
Admission: EM | Admit: 2023-06-21 | Discharge: 2023-06-21 | Discharge status: Against Medical Advice | Payer: Self-pay | Source: Home / Self Care

## 2023-06-21 ENCOUNTER — Emergency Department (HOSPITAL_COMMUNITY): Payer: Self-pay

## 2023-06-21 ENCOUNTER — Encounter (HOSPITAL_COMMUNITY): Payer: Self-pay

## 2023-06-21 LAB — COMPREHENSIVE METABOLIC PANEL
ALT: 23 U/L (ref 0–44)
AST: 45 U/L — ABNORMAL HIGH (ref 15–41)
Albumin: 3.5 g/dL (ref 3.5–5.0)
Alkaline Phosphatase: 53 U/L (ref 38–126)
Anion gap: 13 (ref 5–15)
BUN: 12 mg/dL (ref 6–20)
CO2: 24 mmol/L (ref 22–32)
Calcium: 8.4 mg/dL — ABNORMAL LOW (ref 8.9–10.3)
Chloride: 104 mmol/L (ref 98–111)
Creatinine, Ser: 1.13 mg/dL (ref 0.61–1.24)
GFR, Estimated: >60 mL/min (ref >60)
Glucose, Bld: 87 mg/dL (ref 70–99)
Potassium: 3.9 mmol/L (ref 3.5–5.1)
Sodium: 141 mmol/L (ref 135–145)
Total Bilirubin: 0.4 mg/dL (ref 0.3–1.2)
Total Protein: 5.9 g/dL — ABNORMAL LOW (ref 6.5–8.1)

## 2023-06-21 LAB — ETHANOL: Alcohol, Ethyl (B): 160 mg/dL — ABNORMAL HIGH (ref <10)

## 2023-06-21 LAB — CBC
HCT: 39 % (ref 39.0–52.0)
Hemoglobin: 12.8 g/dL — ABNORMAL LOW (ref 13.0–17.0)
MCH: 30.6 pg (ref 26.0–34.0)
MCHC: 32.8 g/dL (ref 30.0–36.0)
MCV: 93.3 fL (ref 80.0–100.0)
Platelets: 259 10*3/uL (ref 150–400)
RBC: 4.18 MIL/uL — ABNORMAL LOW (ref 4.22–5.81)
RDW: 12.8 % (ref 11.5–15.5)
WBC: 8 10*3/uL (ref 4.0–10.5)
nRBC: 0 % (ref 0.0–0.2)

## 2023-06-21 MED ORDER — IBUPROFEN 400 MG PO TABS
600.0000 mg | ORAL_TABLET | Freq: Once | ORAL | Status: AC
  Administered 2023-06-21: 600 mg via ORAL
  Filled 2023-06-21: qty 1

## 2023-06-21 NOTE — Discharge Instructions (Signed)
Take tylenol or ibuprofen for pain
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
## 2023-06-22 ENCOUNTER — Other Ambulatory Visit: Payer: Self-pay

## 2023-06-22 ENCOUNTER — Emergency Department (HOSPITAL_COMMUNITY)
Admission: EM | Admit: 2023-06-22 | Discharge: 2023-06-22 | Disposition: A | Discharge status: Home / Self Care | Payer: Self-pay | Attending: Emergency Medicine | Admitting: Emergency Medicine

## 2023-06-22 ENCOUNTER — Emergency Department (HOSPITAL_COMMUNITY): Payer: Self-pay

## 2023-06-22 ENCOUNTER — Encounter (HOSPITAL_COMMUNITY): Payer: Self-pay | Admitting: Emergency Medicine

## 2023-06-22 DIAGNOSIS — S83412A Sprain of medial collateral ligament of left knee, initial encounter: Secondary | ICD-10-CM | POA: Insufficient documentation

## 2023-06-22 DIAGNOSIS — S8990XA Unspecified injury of unspecified lower leg, initial encounter: Secondary | ICD-10-CM

## 2023-06-22 DIAGNOSIS — G8929 Other chronic pain: Secondary | ICD-10-CM | POA: Insufficient documentation

## 2023-06-22 DIAGNOSIS — M25562 Pain in left knee: Secondary | ICD-10-CM | POA: Insufficient documentation

## 2023-06-22 DIAGNOSIS — X58XXXA Exposure to other specified factors, initial encounter: Secondary | ICD-10-CM | POA: Insufficient documentation

## 2023-06-22 MED ORDER — ACETAMINOPHEN 500 MG PO TABS
1000.0000 mg | ORAL_TABLET | Freq: Once | ORAL | Status: AC
  Administered 2023-06-22: 1000 mg via ORAL
  Filled 2023-06-22: qty 2

## 2023-06-22 MED ORDER — IBUPROFEN 400 MG PO TABS
600.0000 mg | ORAL_TABLET | Freq: Once | ORAL | Status: AC
  Administered 2023-06-22: 600 mg via ORAL
  Filled 2023-06-22: qty 1
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
## 2023-06-22 NOTE — Discharge Instructions (Addendum)
Thank you for letting us take care of you today.  There were no changes on your x-ray. We see a chronic change that is likely related to a previous MCL injury. You will need to follow up with orthopedics or the bone specialists for further management of this. I provided you with the information for a local orthopedic clinic. Please call them and schedule a follow up appointment. Return for any new, emergent symptoms.
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
## 2023-06-22 NOTE — ED Triage Notes (Signed)
Patient returns again this morning still complaining of L knee pain. No new injuries since returning. Ambulatory to triage. Asking for his pants to be washed.
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
## 2023-06-27 ENCOUNTER — Emergency Department (HOSPITAL_COMMUNITY)
Admission: EM | Admit: 2023-06-27 | Discharge: 2023-06-27 | Disposition: A | Discharge status: Home / Self Care | Payer: Self-pay | Attending: Emergency Medicine | Admitting: Emergency Medicine

## 2023-06-27 ENCOUNTER — Emergency Department (HOSPITAL_COMMUNITY): Payer: Self-pay

## 2023-06-27 ENCOUNTER — Other Ambulatory Visit: Payer: Self-pay

## 2023-06-27 DIAGNOSIS — M25562 Pain in left knee: Secondary | ICD-10-CM | POA: Insufficient documentation

## 2023-06-27 DIAGNOSIS — G8929 Other chronic pain: Secondary | ICD-10-CM | POA: Insufficient documentation

## 2023-06-27 MED ORDER — IBUPROFEN 800 MG PO TABS
800.0000 mg | ORAL_TABLET | Freq: Once | ORAL | Status: AC
  Administered 2023-06-27: 800 mg via ORAL
  Filled 2023-06-27: qty 1

## 2023-06-27 MED ORDER — ACETAMINOPHEN 325 MG PO TABS
650.0000 mg | ORAL_TABLET | Freq: Once | ORAL | Status: AC
  Administered 2023-06-27: 650 mg via ORAL
  Filled 2023-06-27: qty 2
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
## 2023-06-27 NOTE — Discharge Instructions (Signed)
Your x-ray shows some arthritis but no evidence of fracture.  Use Tylenol or Motrin as needed for pain.  Follow-up with your orthopedic doctor.  Return to the ED with worsening pain, fever, weakness, numbness, tingling or other concerns.

## 2023-07-07 ENCOUNTER — Emergency Department (HOSPITAL_COMMUNITY)
Admission: EM | Admit: 2023-07-07 | Discharge: 2023-07-07 | Disposition: A | Discharge status: Home / Self Care | Payer: Self-pay | Attending: Emergency Medicine | Admitting: Emergency Medicine

## 2023-07-07 ENCOUNTER — Other Ambulatory Visit: Payer: Self-pay

## 2023-07-07 DIAGNOSIS — G8929 Other chronic pain: Secondary | ICD-10-CM | POA: Insufficient documentation

## 2023-07-07 DIAGNOSIS — M25562 Pain in left knee: Secondary | ICD-10-CM | POA: Insufficient documentation

## 2023-07-07 MED ORDER — ACETAMINOPHEN 500 MG PO TABS
1000.0000 mg | ORAL_TABLET | Freq: Once | ORAL | Status: AC
  Administered 2023-07-07: 1000 mg via ORAL
  Filled 2023-07-07: qty 2

## 2023-07-07 NOTE — ED Provider Notes (Signed)
EMERGENCY DEPARTMENT AT [facility] Provider Note   CSN: [id number] Arrival date & time: 07/07/23  0445     History  Chief Complaint  Patient presents with   Left Knee Pain     Zachary Kelley is a 42 y.o. male.  The history is provided by the patient.  Knee Pain Location:  Knee Knee location:  L knee Pain details:    Quality:  Aching   Radiates to:  Does not radiate   Severity:  Moderate   Timing:  Constant Chronicity:  Chronic Dislocation: no   Relieved by:  Nothing Worsened by:  Nothing Ineffective treatments:  None tried Associated symptoms: no back pain, no decreased ROM and no fatigue   Risk factors: no concern for non-accidental trauma        Home Medications Prior to Admission medications   Medication Sig Start Date End Date Taking? Authorizing Provider  amoxicillin-clavulanate (AUGMENTIN) 875-125 MG tablet Take 1 tablet by mouth every 12 (twelve) hours. 11/04/22   Small, Brooke L, PA  diphenhydrAMINE (BENADRYL) 25 MG tablet Take 1 tablet (25 mg total) by mouth every 6 (six) hours as needed. 07/02/22   Mesner, Barbara Cower, MD  lidocaine (LIDODERM) 5 % Place 1 patch onto the skin daily. Remove & Discard patch within 12 hours or as directed by MD 02/27/23   Nicanor Alcon, Dempsy Damiano, MD  nystatin cream (MYCOSTATIN) Apply 1 Application topically 4 (four) times daily. Apply to affected area every 4-6 hours x 10 days 07/02/22   Mesner, Barbara Cower, MD      Allergies    Patient has no known allergies.    Review of Systems   Review of Systems  Constitutional:  Negative for fatigue.  HENT:  Negative for facial swelling.   Musculoskeletal:  Positive for arthralgias. Negative for back pain and joint swelling.  All other systems reviewed and are negative.   Physical Exam Updated Vital Signs BP 117/83 (BP Location: Right Arm)   Pulse 82   Temp 98.2 F (36.8 C)   Resp 18   SpO2 100%  Physical Exam Vitals and nursing note reviewed.  Constitutional:      General: He  is not in acute distress.    Appearance: Normal appearance. He is well-developed. He is not diaphoretic.  HENT:     Head: Normocephalic and atraumatic.     Nose: Nose normal.  Eyes:     Conjunctiva/sclera: Conjunctivae normal.     Pupils: Pupils are equal, round, and reactive to light.  Cardiovascular:     Rate and Rhythm: Normal rate and regular rhythm.     Pulses: Normal pulses.     Heart sounds: Normal heart sounds.  Pulmonary:     Effort: Pulmonary effort is normal.     Breath sounds: Normal breath sounds. No wheezing or rales.  Abdominal:     General: Bowel sounds are normal.     Palpations: Abdomen is soft.     Tenderness: There is no abdominal tenderness. There is no guarding or rebound.  Musculoskeletal:        General: Normal range of motion.     Cervical back: Normal range of motion and neck supple.     Right knee: No bony tenderness. No tenderness. No LCL laxity, MCL laxity, ACL laxity or PCL laxity. Normal patellar mobility.     Instability Tests: Anterior drawer test negative. Posterior drawer test negative. Anterior Lachman test negative. Medial McMurray test negative and lateral McMurray test negative.  Left knee: No swelling, deformity, effusion, erythema, ecchymosis, lacerations, bony tenderness or crepitus. Normal range of motion. No tenderness. No LCL laxity, MCL laxity, ACL laxity or PCL laxity.Normal alignment and normal patellar mobility.     Instability Tests: Anterior drawer test negative. Posterior drawer test negative. Anterior Lachman test negative. Medial McMurray test negative and lateral McMurray test negative.     Left lower leg: Normal.     Right ankle:     Right Achilles Tendon: Normal.     Left ankle:     Left Achilles Tendon: Normal.     Right foot: No deformity.     Left foot: No deformity.  Skin:    General: Skin is warm and dry.     Capillary Refill: Capillary refill takes less than 2 seconds.  Neurological:     General: No focal deficit  present.     Mental Status: He is alert and oriented to person, place, and time.     Deep Tendon Reflexes: Reflexes normal.  Psychiatric:        Mood and Affect: Mood normal.     ED Results / Procedures / Treatments   Labs (all labs ordered are listed, but only abnormal results are displayed) Labs Reviewed - No data to display  EKG None  Radiology No results found.  Procedures Procedures    Medications Ordered in ED Medications  acetaminophen (TYLENOL) tablet 1,000 mg (has no administration in time range)    ED Course/ Medical Decision Making/ A&P                                 Medical Decision Making Patient with oongoing knee pain   Risk OTC drugs. Risk Details: Knee pain that is chronic without instability.  Stable for discharge with close follow up.      Final Clinical Impression(s) / ED Diagnoses Final diagnoses:  Chronic pain of left knee   Return for intractable cough, coughing up blood, fevers > 100.4 unrelieved by medication, shortness of breath, intractable vomiting, chest pain, shortness of breath, weakness, numbness, changes in speech, facial asymmetry, abdominal pain, passing out, Inability to tolerate liquids or food, cough, altered mental status or any concerns. No signs of systemic illness or infection. The patient is nontoxic-appearing on exam and vital signs are within normal limits.  I have reviewed the triage vital signs and the nursing notes. Pertinent labs & imaging results that were available during my care of the patient were reviewed by me and considered in my medical decision making (see chart for details). After history, exam, and medical workup I feel the patient has been appropriately medically screened and is safe for discharge home. Pertinent diagnoses were discussed with the patient. Patient was given return precautions.  Rx / DC Orders ED Discharge Orders     None         [name], MD 07/07/23 0500
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
## 2023-07-30 ENCOUNTER — Emergency Department (HOSPITAL_COMMUNITY)
Admission: EM | Admit: 2023-07-30 | Discharge: 2023-07-31 | Discharge status: Against Medical Advice | Payer: Self-pay | Attending: Emergency Medicine | Admitting: Emergency Medicine

## 2023-07-30 ENCOUNTER — Other Ambulatory Visit: Payer: Self-pay

## 2023-07-30 DIAGNOSIS — K0889 Other specified disorders of teeth and supporting structures: Secondary | ICD-10-CM

## 2023-07-30 DIAGNOSIS — S025XXA Fracture of tooth (traumatic), initial encounter for closed fracture: Secondary | ICD-10-CM | POA: Insufficient documentation

## 2023-07-30 DIAGNOSIS — X58XXXA Exposure to other specified factors, initial encounter: Secondary | ICD-10-CM | POA: Insufficient documentation

## 2023-07-30 NOTE — ED Triage Notes (Signed)
Patient reports right lower molar pain this week .

## 2023-07-31 ENCOUNTER — Encounter (HOSPITAL_COMMUNITY): Payer: Self-pay

## 2023-07-31 MED ORDER — PENICILLIN V POTASSIUM 500 MG PO TABS: 500.0000 mg | ORAL_TABLET | Freq: Four times a day (QID) | ORAL | 0 refills | Status: AC

## 2023-07-31 MED ORDER — PENICILLIN V POTASSIUM 250 MG PO TABS
500.0000 mg | ORAL_TABLET | Freq: Once | ORAL | Status: AC
  Administered 2023-07-31: 500 mg via ORAL
  Filled 2023-07-31: qty 2

## 2023-07-31 MED ORDER — NAPROXEN 250 MG PO TABS
500.0000 mg | ORAL_TABLET | Freq: Once | ORAL | Status: AC
  Administered 2023-07-31: 500 mg via ORAL
  Filled 2023-07-31: qty 2

## 2023-07-31 MED ORDER — NAPROXEN 500 MG PO TABS: 500.0000 mg | ORAL_TABLET | Freq: Two times a day (BID) | ORAL | 0 refills | Status: DC

## 2023-07-31 NOTE — ED Provider Notes (Signed)
[place] EMERGENCY DEPARTMENT AT [facility] Provider Note   CSN: [id number] Arrival date & time: 07/30/23  2336     History  Chief Complaint  Patient presents with   Dental Pain    Zachary Kelley is a 42 y.o. male.  The history is provided by the patient and medical records.  Dental Pain  42 y.o. M here with left upper dental pain for the past week.  States tooth broke about 2 months ago but no issues until recently.  He reports it feels swollen but no difficulty swallowing or SOB.  He is able to eat as long as he does not bite into anything.  No fever/chills.  Does not have a dentist.  No meds PTA.  Home Medications Prior to Admission medications   Medication Sig Start Date End Date Taking? Authorizing Provider  naproxen (NAPROSYN) 500 MG tablet Take 1 tablet (500 mg total) by mouth 2 (two) times daily. 07/31/23  Yes Garlon Hatchet, PA-C  penicillin v potassium (VEETID) 500 MG tablet Take 1 tablet (500 mg total) by mouth 4 (four) times daily for 10 days. 07/31/23 08/10/23 Yes Garlon Hatchet, PA-C  amoxicillin-clavulanate (AUGMENTIN) 875-125 MG tablet Take 1 tablet by mouth every 12 (twelve) hours. 11/04/22   Small, Brooke L, PA  diphenhydrAMINE (BENADRYL) 25 MG tablet Take 1 tablet (25 mg total) by mouth every 6 (six) hours as needed. 07/02/22   Mesner, Barbara Cower, MD  lidocaine (LIDODERM) 5 % Place 1 patch onto the skin daily. Remove & Discard patch within 12 hours or as directed by MD 02/27/23   Nicanor Alcon, April, MD  nystatin cream (MYCOSTATIN) Apply 1 Application topically 4 (four) times daily. Apply to affected area every 4-6 hours x 10 days 07/02/22   Mesner, Barbara Cower, MD      Allergies    Patient has no known allergies.    Review of Systems   Review of Systems  HENT:  Positive for dental problem.   All other systems reviewed and are negative.   Physical Exam Updated Vital Signs BP 120/78 (BP Location: Right Arm)   Pulse 90   Temp 98.9 F (37.2 C) (Oral)   Resp 16    Ht 6' (1.829 m)   Wt 78 kg   SpO2 97%   BMI 23.32 kg/m   Physical Exam Vitals and nursing note reviewed.  Constitutional:      Appearance: He is well-developed.  HENT:     Head: Normocephalic and atraumatic.     Mouth/Throat:     Comments: Teeth largely in poor dentition, left upper central incisor broken, surrounding gingiva appears irritated but no discrete abscess or fluid collection, handling secretions appropriately, no trismus, no facial or neck swelling, normal phonation without stridor Eyes:     Conjunctiva/sclera: Conjunctivae normal.     Pupils: Pupils are equal, round, and reactive to light.  Cardiovascular:     Rate and Rhythm: Normal rate and regular rhythm.     Heart sounds: Normal heart sounds.  Pulmonary:     Effort: Pulmonary effort is normal.     Breath sounds: Normal breath sounds.  Abdominal:     General: Bowel sounds are normal.     Palpations: Abdomen is soft.  Musculoskeletal:        General: Normal range of motion.     Cervical back: Normal range of motion.  Skin:    General: Skin is warm and dry.  Neurological:     Mental Status: He  is alert and oriented to person, place, and time.     ED Results / Procedures / Treatments   Labs (all labs ordered are listed, but only abnormal results are displayed) Labs Reviewed - No data to display  EKG None  Radiology No results found.  Procedures Procedures    Medications Ordered in ED Medications  penicillin v potassium (VEETID) tablet 500 mg (500 mg Oral Given 07/31/23 0241)  naproxen (NAPROSYN) tablet 500 mg (500 mg Oral Given 07/31/23 0241)    ED Course/ Medical Decision Making/ A&P                                 Medical Decision Making Risk Prescription drug management.   42 year old male here with left upper dental pain.  Left upper central incisor is broken with some gingival inflammation surrounding but no discrete abscess or fluid collection.  He has no facial or neck swelling,  handling secretions well, no stridor.  Not clinically concerning for Ludwig's angina.  Will start on antibiotics and referred to dentistry for follow-up.  Can return here for new concerns.  Final Clinical Impression(s) / ED Diagnoses Final diagnoses:  Pain, dental    Rx / DC Orders ED Discharge Orders          Ordered    naproxen (NAPROSYN) 500 MG tablet  2 times daily        07/31/23 0242    penicillin v potassium (VEETID) 500 MG tablet  4 times daily        07/31/23 0242              Garlon Hatchet, PA-C 07/31/23 4098    Tilden Fossa, MD 07/31/23 [phone]

## 2023-07-31 NOTE — Discharge Instructions (Signed)
Take the prescribed medication as directed. Follow-up with dentist-- Dr. Rocky Morel is on call.  If you cannot see them I have also attached dental resource guide with list of other local clinics. Return to the ED for new or worsening symptoms.

## 2023-07-31 NOTE — ED Notes (Signed)
Pt called multiple times in lobby with no answer

## 2023-08-18 ENCOUNTER — Other Ambulatory Visit: Payer: Self-pay

## 2023-08-18 ENCOUNTER — Emergency Department (HOSPITAL_COMMUNITY)
Admission: EM | Admit: 2023-08-18 | Discharge: 2023-08-18 | Disposition: A | Discharge status: Home / Self Care | Payer: Self-pay | Attending: Emergency Medicine | Admitting: Emergency Medicine

## 2023-08-18 ENCOUNTER — Encounter (HOSPITAL_COMMUNITY): Payer: Self-pay | Admitting: Student

## 2023-08-18 DIAGNOSIS — G8929 Other chronic pain: Secondary | ICD-10-CM | POA: Insufficient documentation

## 2023-08-18 DIAGNOSIS — M25562 Pain in left knee: Secondary | ICD-10-CM | POA: Insufficient documentation

## 2023-08-18 MED ORDER — NAPROXEN 250 MG PO TABS
500.0000 mg | ORAL_TABLET | Freq: Once | ORAL | Status: AC
  Administered 2023-08-18: 500 mg via ORAL
  Filled 2023-08-18: qty 2

## 2023-08-18 MED ORDER — NAPROXEN 500 MG PO TABS: 500.0000 mg | ORAL_TABLET | Freq: Two times a day (BID) | ORAL | 0 refills | Status: AC | PRN

## 2023-08-18 MED ORDER — NAPROXEN 250 MG PO TABS: 375.0000 mg | ORAL_TABLET | Freq: Once | ORAL | Status: DC

## 2023-08-18 NOTE — ED Triage Notes (Signed)
Patient reports left knee pain this morning , denies injury /ambulatory .Marland Kitchen

## 2023-08-18 NOTE — Progress Notes (Signed)
Orthopedic Tech Progress Note Patient Details:  Zachary Kelley 01-02-1981 [id number] Patient wanted to apply the brace himself Ortho Devices Type of Ortho Device: Knee Sleeve Ortho Device/Splint Location: LLE Ortho Device/Splint Interventions: Ordered      [name] [name] 08/18/2023, 7:59 AM

## 2023-08-18 NOTE — Discharge Instructions (Signed)
Please read and follow all provided instructions.  You have been seen today for left knee pain Utilize the brace as needed for comfort/stability   Medications:  - Naproxen is a nonsteroidal anti-inflammatory medication that will help with pain and swelling. Be sure to take this medication as prescribed with food, 1 pill every 12 hours,  It should be taken with food, as it can cause stomach upset, and more seriously, stomach bleeding. Do not take other nonsteroidal anti-inflammatory medications with this such as Advil, Motrin, Aleve, Mobic, Goodie Powder, or Motrin.    You make take Tylenol per over the counter dosing with these medications.   We have prescribed you new medication(s) today. Discuss the medications prescribed today with your pharmacist as they can have adverse effects and interactions with your other medicines including over the counter and prescribed medications. Seek medical evaluation if you start to experience new or abnormal symptoms after taking one of these medicines, seek care immediately if you start to experience difficulty breathing, feeling of your throat closing, facial swelling, or rash as these could be indications of a more serious allergic reaction   Follow-up instructions: Please follow-up with your primary care provider or the provided orthopedic physician.   Return instructions:  Please return if your digits or extremity are numb or tingling, appear gray or blue, or you have severe pain (also elevate the extremity and loosen splint or wrap if you were given one) Please return if you have redness or fevers.  Please return to the Emergency Department if you experience worsening symptoms.  Please return if you have any other emergent concerns. Additional Information:  Your vital signs today were: BP (!) 147/84 (BP Location: Right Arm)   Pulse 74   Temp 98.2 F (36.8 C) (Oral)   Resp 16   SpO2 99%  If your blood pressure (BP) was elevated above 135/85 this  visit, please have this repeated by your doctor within one month. ---------------
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
